# Patient Record
Sex: Male | Born: 1965 | Race: White | Hispanic: Yes | Marital: Single | Smoking: Never smoker
Health system: Southern US, Community
[De-identification: ages and names within clinical notes are randomized; demographics above are authoritative.]

## PROBLEM LIST (undated history)

## (undated) DIAGNOSIS — M109 Gout, unspecified: Secondary | ICD-10-CM

## (undated) DIAGNOSIS — N200 Calculus of kidney: Secondary | ICD-10-CM

## (undated) DIAGNOSIS — J329 Chronic sinusitis, unspecified: Secondary | ICD-10-CM

## (undated) DIAGNOSIS — J302 Other seasonal allergic rhinitis: Secondary | ICD-10-CM

## (undated) DIAGNOSIS — I10 Essential (primary) hypertension: Secondary | ICD-10-CM

## (undated) HISTORY — PX: ORIF RADIUS & ULNA FRACTURES: SHX2129

---

## 2009-02-06 ENCOUNTER — Encounter (INDEPENDENT_AMBULATORY_CARE_PROVIDER_SITE_OTHER): Payer: Self-pay | Admitting: Internal Medicine

## 2009-02-06 ENCOUNTER — Ambulatory Visit: Payer: Self-pay | Admitting: Family Medicine

## 2009-02-06 LAB — CONVERTED CEMR LAB
Albumin: 4.8 g/dL (ref 3.5–5.2)
Alkaline Phosphatase: 61 units/L (ref 39–117)
BUN: 17 mg/dL (ref 6–23)
Basophils Absolute: 0 10*3/uL (ref 0.0–0.1)
Basophils Relative: 1 % (ref 0–1)
Cholesterol: 213 mg/dL — ABNORMAL HIGH (ref 0–200)
Eosinophils Relative: 7 % — ABNORMAL HIGH (ref 0–5)
Glucose, Bld: 95 mg/dL (ref 70–99)
HCT: 48.9 % (ref 39.0–52.0)
HDL: 42 mg/dL (ref >39)
Hemoglobin: 17 g/dL (ref 13.0–17.0)
LDL Cholesterol: 140 mg/dL — ABNORMAL HIGH (ref 0–99)
MCHC: 34.8 g/dL (ref 30.0–36.0)
MCV: 91.2 fL (ref 78.0–100.0)
Monocytes Absolute: 0.4 10*3/uL (ref 0.1–1.0)
RDW: 12.3 % (ref 11.5–15.5)
Total Bilirubin: 0.4 mg/dL (ref 0.3–1.2)
Triglycerides: 156 mg/dL — ABNORMAL HIGH (ref <150)
VLDL: 31 mg/dL (ref 0–40)

## 2009-04-08 ENCOUNTER — Ambulatory Visit: Payer: Self-pay | Admitting: Family Medicine

## 2013-04-26 ENCOUNTER — Emergency Department (HOSPITAL_COMMUNITY): Payer: Self-pay

## 2013-04-26 ENCOUNTER — Encounter (HOSPITAL_COMMUNITY): Payer: Self-pay | Admitting: Emergency Medicine

## 2013-04-26 ENCOUNTER — Emergency Department (HOSPITAL_COMMUNITY)
Admission: EM | Admit: 2013-04-26 | Discharge: 2013-04-26 | Disposition: A | Discharge status: Home / Self Care | Payer: Self-pay | Attending: Emergency Medicine | Admitting: Emergency Medicine

## 2013-04-26 DIAGNOSIS — J3489 Other specified disorders of nose and nasal sinuses: Secondary | ICD-10-CM | POA: Insufficient documentation

## 2013-04-26 DIAGNOSIS — J189 Pneumonia, unspecified organism: Secondary | ICD-10-CM | POA: Insufficient documentation

## 2013-04-26 DIAGNOSIS — R062 Wheezing: Secondary | ICD-10-CM | POA: Insufficient documentation

## 2013-04-26 DIAGNOSIS — R350 Frequency of micturition: Secondary | ICD-10-CM | POA: Insufficient documentation

## 2013-04-26 DIAGNOSIS — E669 Obesity, unspecified: Secondary | ICD-10-CM | POA: Insufficient documentation

## 2013-04-26 DIAGNOSIS — R059 Cough, unspecified: Secondary | ICD-10-CM | POA: Insufficient documentation

## 2013-04-26 DIAGNOSIS — R05 Cough: Secondary | ICD-10-CM | POA: Insufficient documentation

## 2013-04-26 DIAGNOSIS — Z8639 Personal history of other endocrine, nutritional and metabolic disease: Secondary | ICD-10-CM | POA: Insufficient documentation

## 2013-04-26 DIAGNOSIS — M62838 Other muscle spasm: Secondary | ICD-10-CM | POA: Insufficient documentation

## 2013-04-26 DIAGNOSIS — R0981 Nasal congestion: Secondary | ICD-10-CM

## 2013-04-26 DIAGNOSIS — I10 Essential (primary) hypertension: Secondary | ICD-10-CM | POA: Insufficient documentation

## 2013-04-26 DIAGNOSIS — Z87442 Personal history of urinary calculi: Secondary | ICD-10-CM | POA: Insufficient documentation

## 2013-04-26 DIAGNOSIS — Z862 Personal history of diseases of the blood and blood-forming organs and certain disorders involving the immune mechanism: Secondary | ICD-10-CM | POA: Insufficient documentation

## 2013-04-26 DIAGNOSIS — R109 Unspecified abdominal pain: Secondary | ICD-10-CM | POA: Insufficient documentation

## 2013-04-26 HISTORY — DX: Calculus of kidney: N20.0

## 2013-04-26 HISTORY — DX: Gout, unspecified: M10.9

## 2013-04-26 HISTORY — DX: Essential (primary) hypertension: I10

## 2013-04-26 LAB — POCT I-STAT, CHEM 8
BUN: 15 mg/dL (ref 6–23)
Calcium, Ion: 1.18 mmol/L (ref 1.12–1.23)
Chloride: 105 mEq/L (ref 96–112)
Creatinine, Ser: 1.2 mg/dL (ref 0.50–1.35)
Glucose, Bld: 107 mg/dL — ABNORMAL HIGH (ref 70–99)
HCT: 46 % (ref 39.0–52.0)
Hemoglobin: 15.6 g/dL (ref 13.0–17.0)
Potassium: 3.7 mEq/L (ref 3.5–5.1)
Sodium: 141 mEq/L (ref 135–145)
TCO2: 27 mmol/L (ref 0–100)

## 2013-04-26 LAB — URINALYSIS, ROUTINE W REFLEX MICROSCOPIC
Glucose, UA: NEGATIVE mg/dL
Ketones, ur: NEGATIVE mg/dL
Leukocytes, UA: NEGATIVE
Specific Gravity, Urine: 1.02 (ref 1.005–1.030)
pH: 6 (ref 5.0–8.0)

## 2013-04-26 MED ORDER — AZITHROMYCIN 250 MG PO TABS
500.0000 mg | ORAL_TABLET | Freq: Once | ORAL | Status: AC
  Administered 2013-04-26: 500 mg via ORAL
  Filled 2013-04-26: qty 2

## 2013-04-26 MED ORDER — FLUTICASONE PROPIONATE 50 MCG/ACT NA SUSP: 2.0000 | Freq: Every day | NASAL | Status: DC

## 2013-04-26 MED ORDER — IBUPROFEN 800 MG PO TABS
800.0000 mg | ORAL_TABLET | Freq: Once | ORAL | Status: AC
  Administered 2013-04-26: 800 mg via ORAL
  Filled 2013-04-26: qty 1

## 2013-04-26 MED ORDER — AZITHROMYCIN 250 MG PO TABS: 250.0000 mg | ORAL_TABLET | Freq: Every day | ORAL | Status: DC

## 2013-04-26 NOTE — ED Notes (Signed)
Family states pt went to a dr on Saturday and was diagnosed with seasonal allergies and was given 2 shots one was an antibiotic and the other was an antihistamine shot  Pt has been using zyrtec and nasocort  Tuesday he woke up feeling fatigue and having diarrhea  Pt is c/o pain in his thigh all the way down to his knee  Pain increased about 3pm today feels like the bone in his leg is sore  Most pain is at the top of his knee  States the pain does not feel muscular but more like the bone hurts  Pain is in both legs

## 2013-04-26 NOTE — ED Provider Notes (Signed)
Medical screening examination/treatment/procedure(s) were performed by non-physician practitioner and as supervising physician I was immediately available for consultation/collaboration.  Judye Lorino, MD 04/26/13 0600 

## 2013-04-26 NOTE — ED Provider Notes (Signed)
History     CSN: [id number]  Arrival date & time 04/26/13  0219   First MD Initiated Contact with Patient 04/26/13 0244      Chief Complaint  Patient presents with  . Leg Pain    (Consider location/radiation/quality/duration/timing/severity/associated sxs/prior treatment) HPI History provided by pt and his wife who is translating.  Pt c/o severe pain bilateral anterior thighs, from hip to knee x 2 days.  Pain feels like it is in the bones.  Aggravated by palpation and bearing weight.  Associated w/ extremity weakness.  No known fever and denies low back pain, bladder/bowel dysfunction, paresthesias, rash.  No trauma.  Has never had these sx in the past.   Past Medical History  Diagnosis Date  . Hypertension   . Gout   . Kidney stone     History reviewed. No pertinent past surgical history.  Family History  Problem Relation Age of Onset  . Hypertension Other     History  Substance Use Topics  . Smoking status: Never Smoker   . Smokeless tobacco: Not on file  . Alcohol Use: No      Review of Systems  HENT: Positive for congestion and rhinorrhea.   Respiratory: Positive for cough and wheezing. Negative for chest tightness and shortness of breath.   Gastrointestinal: Negative for nausea and vomiting.       Isolated episode of abd pain and diarrhea last night  Genitourinary: Positive for frequency. Negative for dysuria and hematuria.       Mild right flank pain since 10pm last night.  H/o kidney stones.   All other systems reviewed and are negative.    Allergies  Review of patient's allergies indicates no known allergies.  Home Medications  No current outpatient prescriptions on file.  BP 122/68  Pulse 101  Temp(Src) 100.3 F (37.9 C) (Oral)  Resp 20  Ht 5\' 9"  (1.753 m)  Wt 280 lb (127.007 kg)  BMI 41.33 kg/m2  SpO2 100%  Physical Exam  Nursing note and vitals reviewed. Constitutional: He is oriented to person, place, and time. He appears well-developed  and well-nourished.  HENT:  Head: Normocephalic and atraumatic.  Eyes:  Normal appearance  Neck: Normal range of motion.  Cardiovascular: Normal rate and regular rhythm.   Pulmonary/Chest: Effort normal and breath sounds normal.  Abdominal: Soft. Bowel sounds are normal. He exhibits no distension. There is no tenderness.  obese  Genitourinary:  No CVA ttp  Musculoskeletal:  Lumbar spine non-tender. Tenderness bilateral hips and anterior thighs.  Full active ROM of LE.  Nml patellar reflexes.  5/5 hip abd/adduction/flexion and ankle plantar flexion strength.  No saddle anesthesia. Distal sensation intact.  2+ DP pulses.  Ambulates w/out diffulty.   Neurological: He is alert and oriented to person, place, and time.  Skin: Skin is warm and dry. No rash noted.  Psychiatric: He has a normal mood and affect. His behavior is normal.    ED Course  Procedures (including critical care time)  Labs Reviewed  POCT I-STAT, CHEM 8 - Abnormal; Notable for the following:    Glucose, Bld 107 (*)    All other components within normal limits  URINALYSIS, ROUTINE W REFLEX MICROSCOPIC   Dg Chest 2 View  04/26/2013   *RADIOLOGY REPORT*  Clinical Data: Shortness of breath; bilateral leg pain.  CHEST - 2 VIEW  Comparison: None.  Findings: The lungs are well-aerated.  Mild left basilar opacity may reflect atelectasis or possibly mild pneumonia.  Pulmonary vascularity  is at the upper limits of normal.  There is no evidence of pleural effusion or pneumothorax.  The heart is borderline enlarged.  No acute osseous abnormalities are seen.  IMPRESSION: Mild left basilar opacity may reflect atelectasis or possibly mild pneumonia.  No definite evidence for pulmonary edema.  Borderline cardiomegaly.   Original Report Authenticated By: Tonia Ghent, M.D.     1. Community acquired pneumonia   2. Nasal congestion       MDM  46yo M w/ h/o HTN, gout, kidney stones and allergic rhinitis presents w/ non-traumatic,  bilateral anterior thigh pain.  Has had both cough and increased urinary freq recently, and had an isolated episode of diarrhea as well as development of right flank pain last night.  New meds include zyrtec.  On exam, NAD, borderline febrile, bilateral ant thigh tenderness, no mid-line spinal tenderness, no NV deficits of LEs, ambulatory, abd benign and no CVA ttp.  CXR, U/A and potassium level pending. Pt to receive ibuprofen for fever/pain. 3:29 AM   U/A negative for infection/hematuria, and Chem 8 unremarkable.  CXR shows possible mild pna L lung base.  Results discussed w/ pt and his wife.  This is likely source of fever and body aches.  Reports resolution of pain w/ ibuprofen.  Received first dose of zithromax and d/c'd home w/ same + fluticasone for nasal congestion secondary to allergic rhinitis.  Advised him to d/c the afrin he has been taking daily for several weeks.   Suggested coridicin and saline nasal spray as well.  Return precautions discussed. 4:23 AM         Otilio Miu, PA-C 04/26/13 [phone]

## 2013-11-08 ENCOUNTER — Ambulatory Visit: Payer: Self-pay | Attending: Internal Medicine | Admitting: Internal Medicine

## 2013-11-08 VITALS — BP 132/86 | HR 100 | Temp 97.9°F | Resp 17 | Wt 289.0 lb

## 2013-11-08 DIAGNOSIS — R21 Rash and other nonspecific skin eruption: Secondary | ICD-10-CM

## 2013-11-08 DIAGNOSIS — J329 Chronic sinusitis, unspecified: Secondary | ICD-10-CM | POA: Insufficient documentation

## 2013-11-08 LAB — COMPREHENSIVE METABOLIC PANEL
Albumin: 4.8 g/dL (ref 3.5–5.2)
Alkaline Phosphatase: 78 U/L (ref 39–117)
BUN: 16 mg/dL (ref 6–23)
CO2: 28 mEq/L (ref 19–32)
Calcium: 10.1 mg/dL (ref 8.4–10.5)
Chloride: 100 mEq/L (ref 96–112)
Glucose, Bld: 101 mg/dL — ABNORMAL HIGH (ref 70–99)
Potassium: 4.2 mEq/L (ref 3.5–5.3)
Sodium: 137 mEq/L (ref 135–145)
Total Protein: 7.3 g/dL (ref 6.0–8.3)

## 2013-11-08 MED ORDER — AZITHROMYCIN 250 MG PO TABS: 250.0000 mg | ORAL_TABLET | Freq: Every day | ORAL | Status: DC

## 2013-11-08 MED ORDER — ALBUTEROL SULFATE HFA 108 (90 BASE) MCG/ACT IN AERS: 2.0000 | INHALATION_SPRAY | Freq: Four times a day (QID) | RESPIRATORY_TRACT | Status: DC | PRN

## 2013-11-08 NOTE — Progress Notes (Signed)
Patient ID: Thomas Woodard, male   DOB: [birth year], 47 y.o.   MRN: [id number]   CC: Congestion  HPI: Patient is 47 year old male who presents to clinic with main concern of progressively worsening nasal congestion, bilateral ear pain, postnasal drip, associated with subjective fevers and chills, cough productive of clear sputum. Patient explained that his symptoms have been chronic in nature but much worse over the past week. He is unable to sleep. He has been taking Afrin spray and explains that it appeared that his symptoms were getting worse. He denies chest pain or shortness of breath, no specific abdominal or urinary concerns. He also brings up concerns with bilateral facial skin change that appears as some burn with similar appearance for her head. He also explains he has had some heat and cold intolerance but is not able to provide specific details.  No Known Allergies Past Medical History  Diagnosis Date  . Hypertension   . Gout   . Kidney stone    Current Outpatient Prescriptions on File Prior to Visit  Medication Sig Dispense Refill  . cetirizine (ZYRTEC) 10 MG tablet Take 10 mg by mouth daily.      . fluticasone (FLONASE) 50 MCG/ACT nasal spray Place 2 sprays into the nose daily.  16 g  2  . triamcinolone (NASACORT) 55 MCG/ACT nasal inhaler Place 2 sprays into the nose 3 (three) times daily.       No current facility-administered medications on file prior to visit.   Family History  Problem Relation Age of Onset  . Hypertension Other    History   Social History  . Marital Status: Single    Spouse Name: N/A    Number of Children: N/A  . Years of Education: N/A   Occupational History  . Not on file.   Social History Main Topics  . Smoking status: Never Smoker   . Smokeless tobacco: Not on file  . Alcohol Use: No  . Drug Use: No  . Sexual Activity: Not on file   Other Topics Concern  . Not on file   Social History Narrative  . No narrative on file     Review of Systems  Constitutional: Per history of present illness HENT: Per history of present illness Eyes: Negative for pain, discharge, redness, itching and visual disturbance.  Respiratory: Per history of present illness Cardiovascular: Negative for chest pain, palpitations and leg swelling.  Gastrointestinal: Negative for abdominal distention.  Genitourinary: Negative for dysuria, urgency, frequency, hematuria, flank pain, decreased urine volume, difficulty urinating and dyspareunia.  Musculoskeletal: Negative for back pain, joint swelling, arthralgias and gait problem.  Neurological: Negative for dizziness, tremors, seizures, syncope, facial asymmetry, speech difficulty, weakness, light-headedness, numbness and headaches.  Hematological: Negative for adenopathy. Does not bruise/bleed easily.  Psychiatric/Behavioral: Negative for hallucinations, behavioral problems, confusion, dysphoric mood, decreased concentration and agitation.    Objective:   Filed Vitals:   11/08/13 1557  BP: 132/86  Pulse: 100  Temp: 97.9 F (36.6 C)  Resp: 17    Physical Exam  Constitutional: Appears well-developed and well-nourished. No distress.  HENT: bilateral ears tympanic membranes very difficult to visualize secondary to significant erythema and edema, significant tenderness with examination in both ears. Nasal congestion noted. Maxillary and frontal sinuses tender bilaterally Eyes: Conjunctivae and EOM are normal. PERRLA, no scleral icterus.  Neck: Normal ROM. Neck supple. No JVD. No tracheal deviation. No thyromegaly.  CVS: RRR, S1/S2 +, no murmurs, no gallops, no carotid bruit.  Pulmonary: Effort and breath  sounds normal, no stridor, rhonchi, wheezes, rales.  Abdominal: Soft. BS +,  no distension, tenderness, rebound or guarding.  Musculoskeletal: Normal range of motion. No edema and no tenderness.  Lymphadenopathy: No lymphadenopathy noted, cervical, inguinal. Neuro: Alert. Normal  reflexes, muscle tone coordination. No cranial nerve deficit. Skin: Bilateral facial macular redness, hyperpigmented area on 4 head  Psychiatric: Normal mood and affect. Behavior, judgment, thought content normal.   Lab Results  Component Value Date   WBC 4.9 02/06/2009   HGB 15.6 04/26/2013   HCT 46.0 04/26/2013   MCV 91.2 02/06/2009   PLT 289 02/06/2009   Lab Results  Component Value Date   CREATININE 1.20 04/26/2013   BUN 15 04/26/2013   NA 141 04/26/2013   K 3.7 04/26/2013   CL 105 04/26/2013   CO2 20 02/06/2009    No results found for this basename: HGBA1C   Lipid Panel     Component Value Date/Time   CHOL 213* 02/06/2009 2015   TRIG 156* 02/06/2009 2015   HDL 42 02/06/2009 2015   CHOLHDL 5.1 Ratio 02/06/2009 2015   VLDL 31 02/06/2009 2015   LDLCALC 140* 02/06/2009 2015       Assessment and plan:   Chronic sinusitis with acute bilateral ear infections - will place on Zithromax as patient explains he has been on Augmentin and amoxicillin in the past but no significant relief. Will also provide albuterol inhaler as needed. Referral to ENT provided for further evaluation of chronic sinusitis. Patient advised to come back to clinic if his symptoms do not improve or if it gets worse. Skin changes - will test ANA, TSH

## 2013-11-08 NOTE — Progress Notes (Signed)
Patient is here for sinus problem Has been having SOB  Congestion Cant sleep Dizzy Has been using afrin nasal spray for almost a year with no relief

## 2013-11-08 NOTE — Patient Instructions (Signed)
Sinusitis   (Sinusitis)  La sinusitis es el enrojecimiento, sensibilidad e hinchazón (inflamación) de los senos paranasales. Los senos paranasales son bolsas de aire que se encuentran dentro de los huesos de la cara (por debajo de los ojos, en la mitad de la frente o por encima de los ojos). En los senos paranasales sanos, el moco puede drenar y el aire circula a través de ellos en su camino hacia la nariz. Sin embargo, cuando se inflaman, el moco y el aire quedan atrapados. Esto hace que se desarrollen bacterias y otros gérmenes y originen una infección.    La sinusitis puede desarrollarse rápidamente y durar sólo un tiempo corto (aguda) o continuar por un período largo (crónica). La sinusitis que dura más de 12 semanas se considera crónica.   CAUSAS   Las causas de la sinusitis son:   · Alergias  · Las anomalías estructurales, como el desplazamiento del cartílago que separa las fosas nasales (desvío del tabique) pueden disminuir el flujo de aire por la nariz y los senos paranasales y afectar su drenaje.  · Las alteraciones funcionales, como cuando los pequeños pelos (cilias) que se encuentran en los senos nasales y que ayudan a eliminar la mucosidad no funcionan correctamente o no están presentes.  SÍNTOMAS   Los síntomas de sinusitis aguda y crónica son los mismos. Los síntomas principales son el dolor y la presión alrededor de los senos paranasales afectados. Otros síntomas son:   · Dolor en los dientes superiores.  · Dolor de oídos.  · Dolor de cabeza.  · Mal aliento.  · Disminución del sentido del olfato y del gusto.  · Tos, que empeora al acostarse.  · Fatiga.  · Fiebre.  · Drenaje de moco espeso por la nariz, que generalmente es de color verde y puede contener pus (purulento).  · Hinchazón y calor en los senos paranasales afectados.  DIAGNÓSTICO   El médico le hará un examen físico. Durante el examen, el médico:   · Revisará su nariz buscando signos de crecimientos anormales en las fosas nasales (pólipos  nasales).  · Palpará los senos paranasales afectados para buscar signos de infección.  · Observará el interior de los senos paranasales (endoscopía) con un dispositivo especial que emite luz (endoscopio) colocándolo dentro de los senos paranasales.  Si el médico sospecha que usted sufre sinusitis crónica, podrá indicar una o más de las siguientes pruebas:   · Pruebas de alergia.  · Cultivo de secreciones nasales: tomará una muestra del moco nasal y la enviará a un laboratorio para detectar bacterias.  · Citología nasal: el médico tomará una muestra de moco de la nariz para determinar si la sinusitis que usted sufre está relacionada con una alergia.  TRATAMIENTO   La mayoría de los casos de sinusitis aguda se deben a una infección viral y se resuelven espontáneamente dentro de los 10 días. En algunos casos se recetan medicamentos para aliviar los síntomas (analgésicos, descongestivos, aerosoles nasales con corticoides o aerosoles salinos).   Sin embargo, para la sinusitis por infección bacteriana, el médico le recetará antibióticos. Los antibióticos son medicamentos que destruyen las bacterias que causan la infección.   Rara vez la sinusitis tiene su origen en una infección por hongos. En estos casos, el médico le recetará un medicamento antifúngico.   Para algunos casos de sinusitis crónica, es necesario someterse a una cirugía. Generalmente se trata de casos en los que la sinusitis se repite más de 3 veces al año, a pesar de otros tratamientos.     INSTRUCCIONES PARA EL CUIDADO EN EL HOGAR   · Tiene que beber gran cantidad de agua. Los líquidos ayudan a disolver el moco para que drene más fácilmente de los senos paranasales.  · Use un humidificador.  · Inhale vapor de 3 a 4 veces al día (por ejemplo, siéntese en el baño con la ducha abierta).  · Aplique un paño tibio y húmedo en su cara 3 ó 4 veces al día, o según las indicaciones de su médico.  · Use un aerosol nasal salino para ayudar a humedecer y limpiar los  senos nasales.  · Tome medicamentos de venta libre o recetados para el aliviar el dolor, el malestar o la fiebre sólo según las indicaciones de su médico.  SOLICITE ATENCIÓN MÉDICA DE INMEDIATO SI:   · Siente más dolor o sufre dolores de cabeza intensos.  · Tiene náuseas, vómitos o somnolencia.  · Observa hinchazón alrededor del rostro.  · Tiene problemas de visión.  · Presenta rigidez en el cuello.  · Tiene dificultad para respirar.  ASEGÚRESE DE QUE:   · Comprende estas instrucciones.  · Controlará su enfermedad.  · Recibirá ayuda de inmediato si no mejora o si empeora.  Document Released: 08/18/2005 Document Revised: 07/11/2013  [employer]® Patient Information ©2014 [employer].

## 2013-11-14 ENCOUNTER — Ambulatory Visit: Payer: Self-pay

## 2013-11-30 ENCOUNTER — Inpatient Hospital Stay (HOSPITAL_COMMUNITY): Admission: RE | Admit: 2013-11-30 | Payer: Self-pay | Source: Ambulatory Visit

## 2013-12-04 ENCOUNTER — Ambulatory Visit (HOSPITAL_COMMUNITY): Admission: RE | Admit: 2013-12-04 | Payer: Self-pay | Source: Ambulatory Visit | Admitting: Otolaryngology

## 2013-12-04 ENCOUNTER — Encounter (HOSPITAL_COMMUNITY): Admission: RE | Payer: Self-pay | Source: Ambulatory Visit

## 2013-12-04 SURGERY — SINUS SURGERY, ENDOSCOPIC, USING COMPUTER-ASSISTED NAVIGATION
Anesthesia: General | Laterality: Bilateral

## 2013-12-06 ENCOUNTER — Ambulatory Visit: Payer: Self-pay

## 2013-12-25 ENCOUNTER — Telehealth: Payer: Self-pay | Admitting: Internal Medicine

## 2013-12-25 NOTE — Telephone Encounter (Signed)
Pt's sister came in today to inform us that the Pt will be receiving surgery soon; Pt is asking us to send authorization [facility]ers[facility]; please f/u

## 2014-03-11 ENCOUNTER — Encounter (HOSPITAL_COMMUNITY): Payer: Self-pay | Admitting: *Deleted

## 2014-03-11 NOTE — Progress Notes (Signed)
Pt does not speek english-will get interpreter-girlfriend speaks very well-he probably has sleep apnea-will ck with anethesia

## 2014-03-11 NOTE — Progress Notes (Signed)
03/11/14 1146  OBSTRUCTIVE SLEEP APNEA  Have you ever been diagnosed with sleep apnea through a sleep study? No  Do you snore loudly (loud enough to be heard through closed doors)?  1  Do you often feel tired, fatigued, or sleepy during the daytime? 0  Has anyone observed you stop breathing during your sleep? 1  Do you have, or are you being treated for high blood pressure? 0  BMI more than 35 kg/m2? 1  Age over 46 years old? 0  Neck circumference greater than 40 cm/16 inches? 1  Gender: 1  Obstructive Sleep Apnea Score 5  Score 4 or greater  Results sent to PCP

## 2014-03-11 NOTE — Progress Notes (Signed)
Reviewed with dr crews-should be ok for here

## 2014-03-13 ENCOUNTER — Ambulatory Visit (HOSPITAL_BASED_OUTPATIENT_CLINIC_OR_DEPARTMENT_OTHER): Payer: 59 | Admitting: Anesthesiology

## 2014-03-13 ENCOUNTER — Encounter (HOSPITAL_BASED_OUTPATIENT_CLINIC_OR_DEPARTMENT_OTHER)
Admission: RE | Disposition: A | Discharge status: Home / Self Care | Payer: Self-pay | Source: Ambulatory Visit | Attending: Otolaryngology

## 2014-03-13 ENCOUNTER — Encounter (HOSPITAL_COMMUNITY): Payer: Self-pay | Admitting: Anesthesiology

## 2014-03-13 ENCOUNTER — Encounter (HOSPITAL_BASED_OUTPATIENT_CLINIC_OR_DEPARTMENT_OTHER): Payer: 59 | Admitting: Anesthesiology

## 2014-03-13 ENCOUNTER — Ambulatory Visit (HOSPITAL_COMMUNITY)
Admission: RE | Admit: 2014-03-13 | Discharge: 2014-03-13 | Disposition: A | Discharge status: Home / Self Care | Payer: 59 | Source: Ambulatory Visit | Attending: Otolaryngology | Admitting: Otolaryngology

## 2014-03-13 DIAGNOSIS — J322 Chronic ethmoidal sinusitis: Secondary | ICD-10-CM | POA: Insufficient documentation

## 2014-03-13 DIAGNOSIS — I10 Essential (primary) hypertension: Secondary | ICD-10-CM | POA: Insufficient documentation

## 2014-03-13 DIAGNOSIS — J320 Chronic maxillary sinusitis: Secondary | ICD-10-CM | POA: Insufficient documentation

## 2014-03-13 DIAGNOSIS — M109 Gout, unspecified: Secondary | ICD-10-CM | POA: Insufficient documentation

## 2014-03-13 DIAGNOSIS — J329 Chronic sinusitis, unspecified: Secondary | ICD-10-CM

## 2014-03-13 DIAGNOSIS — J343 Hypertrophy of nasal turbinates: Secondary | ICD-10-CM | POA: Insufficient documentation

## 2014-03-13 DIAGNOSIS — J321 Chronic frontal sinusitis: Secondary | ICD-10-CM | POA: Insufficient documentation

## 2014-03-13 DIAGNOSIS — J301 Allergic rhinitis due to pollen: Secondary | ICD-10-CM | POA: Insufficient documentation

## 2014-03-13 HISTORY — DX: Chronic sinusitis, unspecified: J32.9

## 2014-03-13 HISTORY — PX: TURBINATE REDUCTION: SHX6157

## 2014-03-13 HISTORY — PX: SINUS ENDO W/FUSION: SHX777

## 2014-03-13 HISTORY — DX: Other seasonal allergic rhinitis: J30.2

## 2014-03-13 LAB — POCT HEMOGLOBIN-HEMACUE: Hemoglobin: 15.7 g/dL (ref 13.0–17.0)

## 2014-03-13 SURGERY — SINUS SURGERY, ENDOSCOPIC, USING COMPUTER-ASSISTED NAVIGATION
Anesthesia: General

## 2014-03-13 MED ORDER — MIDAZOLAM HCL 2 MG/2ML IJ SOLN
INTRAMUSCULAR | Status: AC
  Filled 2014-03-13: qty 2

## 2014-03-13 MED ORDER — METOCLOPRAMIDE HCL 5 MG/ML IJ SOLN
INTRAMUSCULAR | Status: DC | PRN
  Administered 2014-03-13: 10 mg via INTRAVENOUS

## 2014-03-13 MED ORDER — MIDAZOLAM HCL 2 MG/2ML IJ SOLN: 1.0000 mg | INTRAMUSCULAR | Status: DC | PRN

## 2014-03-13 MED ORDER — LACTATED RINGERS IV SOLN
INTRAVENOUS | Status: DC | PRN
  Administered 2014-03-13 (×2): via INTRAVENOUS

## 2014-03-13 MED ORDER — ONDANSETRON HCL 4 MG/2ML IJ SOLN: 4.0000 mg | Freq: Once | INTRAMUSCULAR | Status: DC | PRN

## 2014-03-13 MED ORDER — PROPOFOL 10 MG/ML IV BOLUS
INTRAVENOUS | Status: AC
  Filled 2014-03-13: qty 20

## 2014-03-13 MED ORDER — MIDAZOLAM HCL 5 MG/5ML IJ SOLN
INTRAMUSCULAR | Status: DC | PRN
  Administered 2014-03-13: 2 mg via INTRAVENOUS

## 2014-03-13 MED ORDER — FENTANYL CITRATE 0.05 MG/ML IJ SOLN: 50.0000 ug | INTRAMUSCULAR | Status: DC | PRN

## 2014-03-13 MED ORDER — DEXAMETHASONE SODIUM PHOSPHATE 4 MG/ML IJ SOLN
INTRAMUSCULAR | Status: DC | PRN
  Administered 2014-03-13: 10 mg via INTRAVENOUS

## 2014-03-13 MED ORDER — CLINDAMYCIN HCL 300 MG PO CAPS: 300.0000 mg | ORAL_CAPSULE | Freq: Three times a day (TID) | ORAL | Status: DC

## 2014-03-13 MED ORDER — FENTANYL CITRATE 0.05 MG/ML IJ SOLN
INTRAMUSCULAR | Status: AC
Start: 2014-03-13 — End: 2014-03-13
  Filled 2014-03-13: qty 8

## 2014-03-13 MED ORDER — LACTATED RINGERS IV SOLN
INTRAVENOUS | Status: DC
  Administered 2014-03-13 (×2): via INTRAVENOUS

## 2014-03-13 MED ORDER — OXYCODONE HCL 5 MG PO TABS
5.0000 mg | ORAL_TABLET | Freq: Once | ORAL | Status: AC | PRN
  Administered 2014-03-13: 5 mg via ORAL

## 2014-03-13 MED ORDER — HYDROMORPHONE HCL PF 1 MG/ML IJ SOLN
INTRAMUSCULAR | Status: AC
  Filled 2014-03-13: qty 1

## 2014-03-13 MED ORDER — ALBUTEROL SULFATE HFA 108 (90 BASE) MCG/ACT IN AERS
INHALATION_SPRAY | RESPIRATORY_TRACT | Status: DC | PRN
  Administered 2014-03-13: 2 via RESPIRATORY_TRACT

## 2014-03-13 MED ORDER — LIDOCAINE HCL (CARDIAC) 10 MG/ML IV SOLN
INTRAVENOUS | Status: DC | PRN
  Administered 2014-03-13: 100 mg via INTRAVENOUS

## 2014-03-13 MED ORDER — ONDANSETRON HCL 4 MG/2ML IJ SOLN
INTRAMUSCULAR | Status: DC | PRN
  Administered 2014-03-13: 4 mg via INTRAVENOUS

## 2014-03-13 MED ORDER — PROPOFOL 10 MG/ML IV BOLUS
INTRAVENOUS | Status: DC | PRN
  Administered 2014-03-13: 300 mg via INTRAVENOUS
  Administered 2014-03-13: 20 mg via INTRAVENOUS
  Administered 2014-03-13: 30 mg via INTRAVENOUS

## 2014-03-13 MED ORDER — HYDROMORPHONE HCL PF 1 MG/ML IJ SOLN
0.2500 mg | INTRAMUSCULAR | Status: DC | PRN
  Administered 2014-03-13 (×2): 0.5 mg via INTRAVENOUS
  Administered 2014-03-13: 0.25 mg via INTRAVENOUS

## 2014-03-13 MED ORDER — FENTANYL CITRATE 0.05 MG/ML IJ SOLN
INTRAMUSCULAR | Status: DC | PRN
  Administered 2014-03-13: 50 ug via INTRAVENOUS
  Administered 2014-03-13: 25 ug via INTRAVENOUS
  Administered 2014-03-13: 50 ug via INTRAVENOUS
  Administered 2014-03-13 (×2): 25 ug via INTRAVENOUS
  Administered 2014-03-13: 50 ug via INTRAVENOUS
  Administered 2014-03-13: 25 ug via INTRAVENOUS
  Administered 2014-03-13: 100 ug via INTRAVENOUS

## 2014-03-13 MED ORDER — NEOSTIGMINE METHYLSULFATE 1 MG/ML IJ SOLN
INTRAMUSCULAR | Status: DC | PRN
  Administered 2014-03-13: 3 mg via INTRAVENOUS

## 2014-03-13 MED ORDER — OXYCODONE HCL 5 MG PO TABS
ORAL_TABLET | ORAL | Status: AC
  Filled 2014-03-13: qty 1

## 2014-03-13 MED ORDER — PHENYLEPHRINE HCL 10 MG/ML IJ SOLN: INTRAMUSCULAR | Status: DC | PRN

## 2014-03-13 MED ORDER — BACITRACIN ZINC 500 UNIT/GM EX OINT
TOPICAL_OINTMENT | CUTANEOUS | Status: AC
  Filled 2014-03-13: qty 28.35

## 2014-03-13 MED ORDER — OXYMETAZOLINE HCL 0.05 % NA SOLN
NASAL | Status: DC | PRN
  Administered 2014-03-13: 1 via NASAL

## 2014-03-13 MED ORDER — EPHEDRINE SULFATE 50 MG/ML IJ SOLN
INTRAMUSCULAR | Status: DC | PRN
  Administered 2014-03-13: 10 mg via INTRAVENOUS

## 2014-03-13 MED ORDER — OXYMETAZOLINE HCL 0.05 % NA SOLN
NASAL | Status: AC
  Filled 2014-03-13: qty 15

## 2014-03-13 MED ORDER — LIDOCAINE-EPINEPHRINE 1 %-1:100000 IJ SOLN
INTRAMUSCULAR | Status: DC | PRN
  Administered 2014-03-13: 3 mL

## 2014-03-13 MED ORDER — BACITRACIN ZINC 500 UNIT/GM EX OINT
TOPICAL_OINTMENT | CUTANEOUS | Status: DC | PRN
  Administered 2014-03-13: 1 via TOPICAL

## 2014-03-13 MED ORDER — ROCURONIUM BROMIDE 100 MG/10ML IV SOLN
INTRAVENOUS | Status: DC | PRN
  Administered 2014-03-13: 50 mg via INTRAVENOUS

## 2014-03-13 MED ORDER — HYDROCODONE-ACETAMINOPHEN 5-500 MG PO TABS: 1.0000 | ORAL_TABLET | Freq: Four times a day (QID) | ORAL | Status: DC | PRN

## 2014-03-13 MED ORDER — OXYCODONE HCL 5 MG/5ML PO SOLN: 5.0000 mg | Freq: Once | ORAL | Status: AC | PRN

## 2014-03-13 MED ORDER — GLYCOPYRROLATE 0.2 MG/ML IJ SOLN
INTRAMUSCULAR | Status: DC | PRN
  Administered 2014-03-13: 0.4 mg via INTRAVENOUS

## 2014-03-13 MED ORDER — LIDOCAINE-EPINEPHRINE 1 %-1:100000 IJ SOLN
INTRAMUSCULAR | Status: AC
  Filled 2014-03-13: qty 1

## 2014-03-13 SURGICAL SUPPLY — 53 items
ATTRACTOMAT 16X20 MAGNETIC DRP (DRAPES) IMPLANT
BLADE RAD40 ROTATE 4M 4 5PK (BLADE) IMPLANT
BLADE RAD60 ROTATE M4 4 5PK (BLADE) IMPLANT
BLADE ROTATE RAD 12 4 M4 (BLADE) IMPLANT
BLADE ROTATE RAD 40 4 M4 (BLADE) ×3 IMPLANT
BLADE ROTATE RAD12 5PK M4 4MM (BLADE) IMPLANT
BLADE ROTATE TRICUT 4X13 M4 (BLADE) ×3 IMPLANT
BLADE TRICUT ROTATE M4 4 5PK (BLADE) IMPLANT
BUR HS RAD FRONTAL 3 (BURR) IMPLANT
CANISTER SUC SOCK COL 7IN (MISCELLANEOUS) ×6 IMPLANT
CANISTER SUCT 1200ML W/VALVE (MISCELLANEOUS) ×3 IMPLANT
COAGULATOR SUCT SWTCH 10FR 6 (ELECTROSURGICAL) ×3 IMPLANT
DECANTER SPIKE VIAL GLASS SM (MISCELLANEOUS) ×3 IMPLANT
DRAPE SURG 17X23 STRL (DRAPES) IMPLANT
DRESSING NASAL KENNEDY 3.5X.9 (MISCELLANEOUS) IMPLANT
DRSG NASAL KENNEDY 3.5X.9 (MISCELLANEOUS)
DRSG NASOPORE 8CM (GAUZE/BANDAGES/DRESSINGS) ×6 IMPLANT
DRSG TELFA 3X8 NADH (GAUZE/BANDAGES/DRESSINGS) IMPLANT
ELECT COATED BLADE 2.86 ST (ELECTRODE) IMPLANT
ELECT REM PT RETURN 9FT ADLT (ELECTROSURGICAL) ×3
ELECTRODE REM PT RTRN 9FT ADLT (ELECTROSURGICAL) ×2 IMPLANT
GLOVE BIOGEL PI IND STRL 7.0 (GLOVE) ×2 IMPLANT
GLOVE BIOGEL PI INDICATOR 7.0 (GLOVE) ×1
GLOVE ECLIPSE 6.5 STRL STRAW (GLOVE) ×3 IMPLANT
GLOVE EXAM NITRILE MD LF STRL (GLOVE) ×3 IMPLANT
GLOVE SS BIOGEL STRL SZ 7.5 (GLOVE) ×2 IMPLANT
GLOVE SUPERSENSE BIOGEL SZ 7.5 (GLOVE) ×1
GOWN STRL REUS W/ TWL LRG LVL3 (GOWN DISPOSABLE) ×4 IMPLANT
GOWN STRL REUS W/TWL LRG LVL3 (GOWN DISPOSABLE) ×2
IV NS 1000ML (IV SOLUTION)
IV NS 1000ML BAXH (IV SOLUTION) IMPLANT
IV NS 500ML (IV SOLUTION) ×1
IV NS 500ML BAXH (IV SOLUTION) ×2 IMPLANT
NEEDLE 27GAX1X1/2 (NEEDLE) ×3 IMPLANT
NEEDLE SPNL 25GX3.5 QUINCKE BL (NEEDLE) IMPLANT
NS IRRIG 1000ML POUR BTL (IV SOLUTION) ×3 IMPLANT
PACK BASIN DAY SURGERY FS (CUSTOM PROCEDURE TRAY) ×3 IMPLANT
PACK ENT DAY SURGERY (CUSTOM PROCEDURE TRAY) ×3 IMPLANT
PAD ENT ADHESIVE 25PK (MISCELLANEOUS) ×3 IMPLANT
PATTIES SURGICAL .5 X3 (DISPOSABLE) ×3 IMPLANT
PENCIL FOOT CONTROL (ELECTRODE) IMPLANT
SOLUTION ANTI FOG 6CC (MISCELLANEOUS) ×3 IMPLANT
SPONGE GAUZE 2X2 8PLY STRL LF (GAUZE/BANDAGES/DRESSINGS) ×3 IMPLANT
SPONGE SURGIFOAM ABS GEL 12-7 (HEMOSTASIS) IMPLANT
SUT CHROMIC 3 0 PS 2 (SUTURE) IMPLANT
SUT ETHILON 3 0 PS 1 (SUTURE) IMPLANT
TOWEL OR 17X24 6PK STRL BLUE (TOWEL DISPOSABLE) ×3 IMPLANT
TRACKER ENT INSTRUMENT (MISCELLANEOUS) ×3 IMPLANT
TRACKER ENT PATIENT (MISCELLANEOUS) ×3 IMPLANT
TRAY DSU PREP LF (CUSTOM PROCEDURE TRAY) ×3 IMPLANT
TUBE CONNECTING 20X1/4 (TUBING) ×3 IMPLANT
TUBING STRAIGHTSHOT EPS 5PK (TUBING) ×3 IMPLANT
YANKAUER SUCT BULB TIP NO VENT (SUCTIONS) ×3 IMPLANT

## 2014-03-13 NOTE — Op Note (Signed)
Preop/postop diagnosis: Chronic sinusitis and turbinate hypertrophy Procedure: Bilateral maxillary antrostomy, bilateral total ethmoidectomy, bilateral nasal frontal exploration, bilateral submucous resection inferior turbinates, and Fusion computer guidance Anesthesia: Gen. Estimated blood loss: Proxy 600 cc Indications: This is a 48 year old with significant nasal obstruction that has been present for many years. He is very frustrated as he has failed medical therapy. He has a CT scan that shows significant polypoid appearance of his turbinates in nasal cavity and chronic sinusitis. He was informed a risk and benefits of the procedure and options were discussed for endoscopic sinus surgery and turbinate reduction. All his questions were answered and consent was obtained. Procedure: Patient was taken to the operating room placed in the supine position after general endotracheal tube anesthesia was prepped and draped in the usual sterile manner. The fusion computer system was positioned calibrated with excellent accuracy. The oxymetazoline pledgets were placed in the inferior and middle turbinate were injected with 1% lidocaine with 1 100,000 epinephrine. The left side was begun which had significant polypoid degeneration of both the inferior and middle turbinate. The middle turbinate was debrided on its lateral aspect opening up the infundibulum. There was polypoid material within the infundibulum also debrided with the microdebrider and fusion guidance. Antrostomy was opened as the uncinate process also was removed from the  maxillary region up into the attachment middle turbinate. The maxillary sinus had thickened mucosa and some polyps right at its opening. The ethmoid bulla was then opened and dissection was carried from posterior to anterior up into the nasal frontal duct again using Fusion guidance and the microdebrider. There was polypoid material throughout and all of his mucosa was excessively bloody.  The inferior turbinate was then addressed which was hypertrophied and polypoid degenerated all the way to the posterior aspect into the nasopharynx region. Using the microdebrider this was removed along its inferior edge. The turbinate was then outfractured the Therapist, nutritionalreer elevator. The edge was cauterized with suction cautery. The right side was repeated in a similar fashion again with polypoid degeneration of both inferior and middle turbinates. The ethmoid, maxillary, frontal were opened as described previously and again polypoid material throughout. The mucosa again was extremely hyperemic with a lot of bleeding. The inferior turbinate also was similar and also trimmed with a microdebrider. This opened up the nasal cavity significantly bilaterally. Edge was cauterized with suction cautery and flap was laid back down and outfracture. There was still some fairly moderate bleeding from the ethmoid region bilaterally some nasal poor soaked in Bactroban was placed into the ethmoid cavities bilaterally. This did give good hemostasis. The nasopharynx was suctioned out of blood and debris. Turbinate seemed to have excellent hemostasis. The oral cavity oropharynx is suctioned out of all blood and debris. The patient is an awake and brought to recovery room in stable condition counts correct

## 2014-03-13 NOTE — Anesthesia Procedure Notes (Signed)
Procedure Name: Intubation Date/Time: 03/13/2014 8:45 AM Performed by: Gar GibbonKEETON, Kathya Wilz S Pre-anesthesia Checklist: Patient identified, Emergency Drugs available, Suction available and Patient being monitored Patient Re-evaluated:Patient Re-evaluated prior to inductionOxygen Delivery Method: Circle System Utilized Preoxygenation: Pre-oxygenation with 100% oxygen Intubation Type: IV induction Ventilation: Mask ventilation without difficulty Grade View: Grade III Tube type: Oral Rae Tube size: 8.0 mm Number of attempts: 1 Airway Equipment and Method: stylet,  oral airway and Video-laryngoscopy Placement Confirmation: ETT inserted through vocal cords under direct vision,  positive ETCO2 and breath sounds checked- equal and bilateral Secured at: 22 cm Tube secured with: Tape Dental Injury: Teeth and Oropharynx as per pre-operative assessment  Difficulty Due To: Difficulty was anticipated, Difficult Airway- due to reduced neck mobility and Difficult Airway- due to limited oral opening

## 2014-03-13 NOTE — H&P (Signed)
Thomas Woodard is an 48 y.o. male.   Chief Complaint: nasal obstruction HPI: hx of severe nasal obstruction with failed medical therapy.  Past Medical History  Diagnosis Date  . Gout   . Kidney stone   . Hypertension     no meds  . Chronic sinusitis   . Seasonal allergies     Past Surgical History  Procedure Laterality Date  . Orif radius & ulna fractures      right arm as a teen    Family History  Problem Relation Age of Onset  . Hypertension Other    Social History:  reports that he has never smoked. He does not have any smokeless tobacco history on file. He reports that he does not drink alcohol or use illicit drugs.  Allergies: No Known Allergies  Medications Prior to Admission  Medication Sig Dispense Refill  . albuterol (PROVENTIL HFA;VENTOLIN HFA) 108 (90 BASE) MCG/ACT inhaler Inhale 2 puffs into the lungs every 6 (six) hours as needed for wheezing or shortness of breath.  1 Inhaler  3  . cetirizine (ZYRTEC) 10 MG tablet Take 10 mg by mouth daily.      Marland Kitchen. oxymetazoline (AFRIN 12 HOUR) 0.05 % nasal spray Place 1 spray into both nostrils 2 (two) times daily.        No results found for this or any previous visit (from the past 48 hour(s)). No results found.  Review of Systems  Constitutional: Negative.   HENT: Negative.   Eyes: Negative.   Respiratory: Negative.   Cardiovascular: Negative.   Skin: Negative.     Blood pressure 125/86, pulse 80, temperature 97.5 F (36.4 C), temperature source Oral, resp. rate 20, height 5\' 9"  (1.753 m), weight 128.277 kg (282 lb 12.8 oz), SpO2 94.00%. Physical Exam  Constitutional: He appears well-developed and well-nourished.  HENT:  Head: Normocephalic.  Mouth/Throat: Oropharynx is clear and moist.  Eyes: Pupils are equal, round, and reactive to light.  Neck: Normal range of motion. Neck supple.  Cardiovascular: Normal rate.   Respiratory: Effort normal.  GI: Soft.     Assessment/Plan Chronic  sinusitis/turbinate hypertrophy- he is ready to proceed with surgery of ESS and SMR.   Thomas ObeyJohn Mahogony Woodard 03/13/2014, 8:12 AM

## 2014-03-13 NOTE — Anesthesia Postprocedure Evaluation (Signed)
  Anesthesia Post-op Note  Patient: Thomas Woodard  Procedure(s) Performed: Procedure(s): ENDOSCOPIC SINUS SURGERY WITH FUSION NAVIGATION (N/A) BILATERAL TURBINATE REDUCTION (Bilateral)  Patient Location: PACU  Anesthesia Type:General  Level of Consciousness: awake, alert  and oriented  Airway and Oxygen Therapy: Patient Spontanous Breathing  Post-op Pain: mild  Post-op Assessment: Post-op Vital signs reviewed  Post-op Vital Signs: Reviewed  Last Vitals:  Filed Vitals:   03/13/14 1130  BP: 114/77  Pulse: 73  Temp:   Resp: 16    Complications: No apparent anesthesia complications

## 2014-03-13 NOTE — Transfer of Care (Signed)
Immediate Anesthesia Transfer of Care Note  Patient: Thomas Woodard  Procedure(s) Performed: Procedure(s): ENDOSCOPIC SINUS SURGERY WITH FUSION NAVIGATION (N/A) BILATERAL TURBINATE REDUCTION (Bilateral)  Patient Location: PACU  Anesthesia Type:General  Level of Consciousness: awake, sedated and patient cooperative  Airway & Oxygen Therapy: Patient Spontanous Breathing and aerosol face mask  Post-op Assessment: Report given to PACU RN and Post -op Vital signs reviewed and stable  Post vital signs: Reviewed and stable  Complications: No apparent anesthesia complications

## 2014-03-13 NOTE — Discharge Instructions (Signed)

## 2014-03-13 NOTE — Anesthesia Preprocedure Evaluation (Addendum)
Anesthesia Evaluation  Patient identified by MRN, date of birth, ID band Patient awake    Reviewed: Allergy & Precautions, H&P , NPO status , Patient's Chart, lab work & pertinent test results  Airway Mallampati: I TM Distance: >3 FB Neck ROM: Full    Dental  (+) Teeth Intact, Dental Advisory Given   Pulmonary  breath sounds clear to auscultation        Cardiovascular hypertension, Pt. on medications Rhythm:Regular Rate:Normal     Neuro/Psych    GI/Hepatic   Endo/Other  Morbid obesity  Renal/GU      Musculoskeletal   Abdominal   Peds  Hematology   Anesthesia Other Findings   Reproductive/Obstetrics                          Anesthesia Physical Anesthesia Plan  ASA: II  Anesthesia Plan: General   Post-op Pain Management:    Induction: Inhalational  Airway Management Planned: LMA  Additional Equipment:   Intra-op Plan:   Post-operative Plan: Extubation in OR  Informed Consent: I have reviewed the patients History and Physical, chart, labs and discussed the procedure including the risks, benefits and alternatives for the proposed anesthesia with the patient or authorized representative who has indicated his/her understanding and acceptance.   Dental advisory given  Plan Discussed with: CRNA, Anesthesiologist and Surgeon  Anesthesia Plan Comments:         Anesthesia Quick Evaluation

## 2014-03-14 ENCOUNTER — Encounter (HOSPITAL_BASED_OUTPATIENT_CLINIC_OR_DEPARTMENT_OTHER): Payer: Self-pay | Admitting: Otolaryngology

## 2015-03-11 ENCOUNTER — Ambulatory Visit: Payer: 59 | Attending: Internal Medicine

## 2017-01-26 ENCOUNTER — Ambulatory Visit (HOSPITAL_BASED_OUTPATIENT_CLINIC_OR_DEPARTMENT_OTHER)
Admit: 2017-01-26 | Discharge: 2017-01-26 | Disposition: A | Discharge status: Home / Self Care | Payer: BLUE CROSS/BLUE SHIELD | Attending: Emergency Medicine | Admitting: Emergency Medicine

## 2017-01-26 ENCOUNTER — Encounter (HOSPITAL_COMMUNITY): Payer: Self-pay

## 2017-01-26 ENCOUNTER — Emergency Department (HOSPITAL_COMMUNITY)
Admission: EM | Admit: 2017-01-26 | Discharge: 2017-01-26 | Disposition: A | Discharge status: Home / Self Care | Payer: BLUE CROSS/BLUE SHIELD | Attending: Emergency Medicine | Admitting: Emergency Medicine

## 2017-01-26 ENCOUNTER — Emergency Department (HOSPITAL_COMMUNITY): Payer: BLUE CROSS/BLUE SHIELD

## 2017-01-26 DIAGNOSIS — R0602 Shortness of breath: Secondary | ICD-10-CM | POA: Diagnosis not present

## 2017-01-26 DIAGNOSIS — I10 Essential (primary) hypertension: Secondary | ICD-10-CM | POA: Insufficient documentation

## 2017-01-26 DIAGNOSIS — M7989 Other specified soft tissue disorders: Secondary | ICD-10-CM | POA: Diagnosis not present

## 2017-01-26 DIAGNOSIS — L03115 Cellulitis of right lower limb: Secondary | ICD-10-CM | POA: Diagnosis not present

## 2017-01-26 DIAGNOSIS — Z79899 Other long term (current) drug therapy: Secondary | ICD-10-CM | POA: Diagnosis not present

## 2017-01-26 LAB — CBC WITH DIFFERENTIAL/PLATELET
Basophils Absolute: 0 10*3/uL (ref 0.0–0.1)
Basophils Relative: 1 %
EOS ABS: 0.4 10*3/uL (ref 0.0–0.7)
EOS PCT: 8 %
HCT: 48.8 % (ref 39.0–52.0)
Hemoglobin: 16.2 g/dL (ref 13.0–17.0)
Lymphocytes Relative: 39 %
Lymphs Abs: 2 10*3/uL (ref 0.7–4.0)
MCH: 30.6 pg (ref 26.0–34.0)
MCHC: 33.2 g/dL (ref 30.0–36.0)
MCV: 92.2 fL (ref 78.0–100.0)
MONOS PCT: 8 %
Monocytes Absolute: 0.4 10*3/uL (ref 0.1–1.0)
Neutro Abs: 2.2 10*3/uL (ref 1.7–7.7)
Neutrophils Relative %: 44 %
PLATELETS: 209 10*3/uL (ref 150–400)
RBC: 5.29 MIL/uL (ref 4.22–5.81)
RDW: 12.4 % (ref 11.5–15.5)
WBC: 5 10*3/uL (ref 4.0–10.5)

## 2017-01-26 LAB — BASIC METABOLIC PANEL
Anion gap: 8 (ref 5–15)
BUN: 18 mg/dL (ref 6–20)
CO2: 31 mmol/L (ref 22–32)
CREATININE: 0.99 mg/dL (ref 0.61–1.24)
Calcium: 9.3 mg/dL (ref 8.9–10.3)
Chloride: 103 mmol/L (ref 101–111)
GFR calc Af Amer: >60 mL/min (ref >60)
GFR calc non Af Amer: >60 mL/min (ref >60)
Glucose, Bld: 102 mg/dL — ABNORMAL HIGH (ref 65–99)
Potassium: 3.9 mmol/L (ref 3.5–5.1)
SODIUM: 142 mmol/L (ref 135–145)

## 2017-01-26 LAB — I-STAT CHEM 8, ED
BUN: 21 mg/dL — AB (ref 6–20)
CHLORIDE: 102 mmol/L (ref 101–111)
CREATININE: 1 mg/dL (ref 0.61–1.24)
Calcium, Ion: 1.17 mmol/L (ref 1.15–1.40)
Glucose, Bld: 105 mg/dL — ABNORMAL HIGH (ref 65–99)
HEMATOCRIT: 50 % (ref 39.0–52.0)
Hemoglobin: 17 g/dL (ref 13.0–17.0)
Potassium: 3.9 mmol/L (ref 3.5–5.1)
Sodium: 141 mmol/L (ref 135–145)
TCO2: 32 mmol/L (ref 0–100)

## 2017-01-26 LAB — I-STAT TROPONIN, ED: TROPONIN I, POC: 0 ng/mL (ref 0.00–0.08)

## 2017-01-26 LAB — BRAIN NATRIURETIC PEPTIDE: B Natriuretic Peptide: 36.4 pg/mL (ref 0.0–100.0)

## 2017-01-26 MED ORDER — CEPHALEXIN 500 MG PO CAPS: 1000.0000 mg | ORAL_CAPSULE | Freq: Four times a day (QID) | ORAL | 0 refills | Status: DC

## 2017-01-26 MED ORDER — CEPHALEXIN 250 MG PO CAPS
1000.0000 mg | ORAL_CAPSULE | Freq: Once | ORAL | Status: AC
  Administered 2017-01-26: 1000 mg via ORAL
  Filled 2017-01-26: qty 4

## 2017-01-26 MED ORDER — CEPHALEXIN 500 MG PO CAPS: 1000.0000 mg | ORAL_CAPSULE | Freq: Four times a day (QID) | ORAL | 0 refills | Status: AC

## 2017-01-26 NOTE — Progress Notes (Signed)
*  PRELIMINARY RESULTS* Vascular Ultrasound Right lower extremity venous duplex has been completed.  Preliminary findings: No evidence of deep vein thrombosis or baker's cyst in the right lower extremity.   Thomas DonningCharlotte C Mahum Woodard 01/26/2017, 1:12 PM

## 2017-01-26 NOTE — ED Provider Notes (Signed)
[facility] DEPT Provider Note   CSN: [id number] Arrival date & time: 01/26/17  1057     History   Chief Complaint Chief Complaint  Patient presents with  . Leg Swelling    right    HPI Thomas Woodard is a 51 y.o. male.  51 yo M with a chief complaint of right leg swelling. This been going on for the past month. Some erythema and swelling. They deny injury. He has had some shortness of breath it's been long-standing. Denies fevers or chills. They're concerned about the extent of the redness. He has not yet sought medical care for this. Has had some recent travel to [place].   The history is provided by the patient.  Illness  This is a new problem. The current episode started less than 1 hour ago. The problem occurs constantly. The problem has not changed since onset.Pertinent negatives include no chest pain, no abdominal pain, no headaches and no shortness of breath. Nothing aggravates the symptoms. Nothing relieves the symptoms. He has tried nothing for the symptoms. The treatment provided no relief.    Past Medical History:  Diagnosis Date  . Chronic sinusitis   . Gout   . Hypertension    no meds  . Kidney stone   . Seasonal allergies     There are no active problems to display for this patient.   Past Surgical History:  Procedure Laterality Date  . ORIF RADIUS & ULNA FRACTURES     right arm as a teen  . SINUS ENDO W/FUSION N/A 03/13/2014   Procedure: ENDOSCOPIC SINUS SURGERY WITH FUSION NAVIGATION;  Surgeon: Suzanna Obey, MD;  Location: [facility];  Service: ENT;  Laterality: N/A;  . TURBINATE REDUCTION Bilateral 03/13/2014   Procedure: BILATERAL TURBINATE REDUCTION;  Surgeon: Suzanna Obey, MD;  Location: [facility];  Service: ENT;  Laterality: Bilateral;       Home Medications    Prior to Admission medications   Medication Sig Start Date End Date Taking? Authorizing Provider  Ascorbic Acid (VITAMIN C) 100 MG tablet Take 100  mg by mouth daily.   Yes Historical Provider, MD  Furosemide (LASIX PO) Take 1 tablet by mouth daily.   Yes Historical Provider, MD  naproxen sodium (ANAPROX) 220 MG tablet Take 220 mg by mouth daily.   Yes Historical Provider, MD  PRESCRIPTION MEDICATION Apply 1 application topically daily as needed (pain). Pain cream   Yes Historical Provider, MD  vitamin E 100 UNIT capsule Take 100 Units by mouth daily.   Yes Historical Provider, MD  albuterol (PROVENTIL HFA;VENTOLIN HFA) 108 (90 BASE) MCG/ACT inhaler Inhale 2 puffs into the lungs every 6 (six) hours as needed for wheezing or shortness of breath. Patient not taking: Reported on 01/26/2017 11/08/13   Dorothea Ogle, MD  cephALEXin (KEFLEX) 500 MG capsule Take 2 capsules (1,000 mg total) by mouth 4 (four) times daily. 01/26/17 02/05/17  Melene Plan, DO  HYDROcodone-acetaminophen (VICODIN) 5-500 MG per tablet Take 1 tablet by mouth every 6 (six) hours as needed for pain. Patient not taking: Reported on 01/26/2017 03/13/14   Suzanna Obey, MD    Family History Family History  Problem Relation Age of Onset  . Hypertension Other     Social History Social History  Substance Use Topics  . Smoking status: Never Smoker  . Smokeless tobacco: Never Used  . Alcohol use No     Comment: very rare     Allergies   Patient has  no known allergies.   Review of Systems Review of Systems  Constitutional: Negative for chills and fever.  HENT: Negative for congestion and facial swelling.   Eyes: Negative for discharge and visual disturbance.  Respiratory: Negative for shortness of breath.   Cardiovascular: Positive for leg swelling. Negative for chest pain and palpitations.  Gastrointestinal: Negative for abdominal pain, diarrhea and vomiting.  Musculoskeletal: Negative for arthralgias and myalgias.  Skin: Positive for color change. Negative for rash.  Neurological: Negative for tremors, syncope and headaches.  Psychiatric/Behavioral: Negative for confusion  and dysphoric mood.     Physical Exam Updated Vital Signs BP 123/93   Pulse 79   Temp 98.6 F (37 C) (Oral)   Resp (!) 27   Ht 5' 10.08" (1.78 m)   Wt 300 lb (136.1 kg)   SpO2 93%   BMI 42.95 kg/m   Physical Exam  Constitutional: He is oriented to person, place, and time. He appears well-developed and well-nourished.  HENT:  Head: Normocephalic and atraumatic.  Eyes: EOM are normal. Pupils are equal, round, and reactive to light.  Neck: Normal range of motion. Neck supple. No JVD present.  Cardiovascular: Normal rate and regular rhythm.  Exam reveals no gallop and no friction rub.   No murmur heard. Pulmonary/Chest: No respiratory distress. He has no wheezes.  Abdominal: He exhibits no distension and no mass. There is no tenderness. There is no rebound and no guarding.  Musculoskeletal: Normal range of motion. He exhibits edema and tenderness.  Erythema noted to the right lower extremity with some swelling. Extends up just past the knee. Pulse motor and sensation is intact distally.  Neurological: He is alert and oriented to person, place, and time.  Skin: No rash noted. No pallor.  Psychiatric: He has a normal mood and affect. His behavior is normal.  Nursing note and vitals reviewed.    ED Treatments / Results  Labs (all labs ordered are listed, but only abnormal results are displayed) Labs Reviewed  BASIC METABOLIC PANEL - Abnormal; Notable for the following:       Result Value   Glucose, Bld 102 (*)    All other components within normal limits  I-STAT CHEM 8, ED - Abnormal; Notable for the following:    BUN 21 (*)    Glucose, Bld 105 (*)    All other components within normal limits  CBC WITH DIFFERENTIAL/PLATELET  BRAIN NATRIURETIC PEPTIDE  I-STAT TROPOININ, ED    EKG  EKG Interpretation None       Radiology Dg Chest 2 View  Result Date: 01/26/2017 CLINICAL DATA:  Right leg redness EXAM: CHEST  2 VIEW COMPARISON:  04/26/2013 FINDINGS: Cardiac shadow  is within normal limits. Mild vascular congestion is seen without interstitial edema. No sizable effusion or focal infiltrate is noted. No bony abnormality is seen. IMPRESSION: Mild increased vascular congestion without focal infiltrate. Electronically Signed   By: Alcide Clever M.D.   On: 01/26/2017 12:01    Procedures Procedures (including critical care time)  Medications Ordered in ED Medications  cephALEXin (KEFLEX) capsule 1,000 mg (1,000 mg Oral Given 01/26/17 1139)     Initial Impression / Assessment and Plan / ED Course  I have reviewed the triage vital signs and the nursing notes.  Pertinent labs & imaging results that were available during my care of the patient were reviewed by me and considered in my medical decision making (see chart for details).     51 yo M with a cc  of a leg swelling.  Going on for past month.  Clinically appears to be cellulitis. Patient is not sought medical care prior to today. He is not having fevers and no white count. The started him on antibiotics. Ultrasound was negative for acute DVT. Will have him follow with his PCP. He may require a repeat imaging study if this persists  He appears to be tachypnea. Abdomen sure of the etiology of this. The family states he has been having trouble breathing for some time. Do not feel is significant only worse with this leg swelling. As he has no DVT and is PERC negative I feel no need for chest imaging other than the chest x-ray which was unremarkable.  1:46 PM:  I have discussed the diagnosis/risks/treatment options with the patient and family and believe the pt to be eligible for discharge home to follow-up with PCP. We also discussed returning to the ED immediately if new or worsening sx occur. We discussed the sx which are most concerning (e.g., sudden worsening pain, fever, inability to tolerate by mouth) that necessitate immediate return. Medications administered to the patient during their visit and any new  prescriptions provided to the patient are listed below.  Medications given during this visit Medications  cephALEXin (KEFLEX) capsule 1,000 mg (1,000 mg Oral Given 01/26/17 1139)     The patient appears reasonably screen and/or stabilized for discharge and I doubt any other medical condition or other [facility] requiring further screening, evaluation, or treatment in the ED at this time prior to discharge.    Final Clinical Impressions(s) / ED Diagnoses   Final diagnoses:  Cellulitis of right lower extremity    New Prescriptions Current Discharge Medication List    START taking these medications   Details  cephALEXin (KEFLEX) 500 MG capsule Take 2 capsules (1,000 mg total) by mouth 4 (four) times daily. Qty: 80 capsule, Refills: 0         Melene Planan Nickie Warwick, DO 01/26/17 1347

## 2017-01-26 NOTE — Discharge Instructions (Signed)
Follow up with your PCP in a couple days for recheck.

## 2017-01-26 NOTE — ED Triage Notes (Signed)
Pt hs redness to the right leg for the past month that has moved from the foot to the mid upper leg. Pt recently traveled out of the country.

## 2017-02-10 ENCOUNTER — Ambulatory Visit (INDEPENDENT_AMBULATORY_CARE_PROVIDER_SITE_OTHER): Payer: BLUE CROSS/BLUE SHIELD | Admitting: Internal Medicine

## 2017-02-10 VITALS — BP 118/90 | HR 96 | Temp 97.7°F | Ht 70.0 in | Wt 311.0 lb

## 2017-02-10 DIAGNOSIS — G4700 Insomnia, unspecified: Secondary | ICD-10-CM

## 2017-02-10 DIAGNOSIS — L0390 Cellulitis, unspecified: Secondary | ICD-10-CM | POA: Diagnosis not present

## 2017-02-10 DIAGNOSIS — Z7689 Persons encountering health services in other specified circumstances: Secondary | ICD-10-CM

## 2017-02-10 DIAGNOSIS — M7989 Other specified soft tissue disorders: Secondary | ICD-10-CM | POA: Diagnosis not present

## 2017-02-10 DIAGNOSIS — I999 Unspecified disorder of circulatory system: Secondary | ICD-10-CM

## 2017-02-10 DIAGNOSIS — E669 Obesity, unspecified: Secondary | ICD-10-CM

## 2017-02-10 DIAGNOSIS — R0989 Other specified symptoms and signs involving the circulatory and respiratory systems: Secondary | ICD-10-CM

## 2017-02-10 DIAGNOSIS — IMO0001 Reserved for inherently not codable concepts without codable children: Secondary | ICD-10-CM

## 2017-02-10 DIAGNOSIS — Z6841 Body Mass Index (BMI) 40.0 and over, adult: Secondary | ICD-10-CM

## 2017-02-10 MED ORDER — CLINDAMYCIN HCL 300 MG PO CAPS: 300.0000 mg | ORAL_CAPSULE | Freq: Three times a day (TID) | ORAL | 0 refills | Status: DC

## 2017-02-10 NOTE — Patient Instructions (Signed)
[name],  Fue un placer conocerte hoy. Le recet clindamicina 300 mg para tomar tres veces al da para su pierna. Deja de tomar el keflex. Por favor, hganme un seguimiento la prxima semana para ver si tienen mejora. La mala circulacin tambin podra estar afectando su curacin, por lo que he pedido ultrasonidos de sus piernas.  Recibir New York Life Insuranceuna llamada sobre el estudio del sueo dentro de la prxima semana.  It was nice to meet you today. I have prescribed clindamycin 300 mg to take three times daily for your leg. Stop taking the keflex. Please follow up with me next week to see if you have improvement. Poor circulation could also be affecting your healing, so I have order ultrasounds of your legs.  You will get a call about the sleep study within the next week.  Best, Dr. Sampson GoonFitzgerald

## 2017-02-10 NOTE — Progress Notes (Signed)
[name] Progress Note  Subjective:  Thomas Woodard is a 51 y.o. male here to establish care. Spanish Agricultural consultant [phone]) assisted visit.  Concerns today:   - Ongoing R leg swelling, which began about 2 months ago when he was on vacation in [place]. There, he had redness and swelling in his lower leg and also involving his foot. He denies pain. He had trauma to the area but says swelling was prior to scraping his shin. He was given an IM dose of antibiotics in [place] that he thinks began with "cef" and had trial of lasix for swelling, but redness has now spread past knee. He denies pain, fevers. He was started on keflex 1000 mg qid on 3/7 after an ED visit with prescription written for 10-day supply, though patient still has a few pills left 15 days since Rx written. Patient had LE [place] negative for DVT on 3/7 with enlarged lymph node noted. Erythema was noted to extend past knee on 3/7, as well.  - Frequent sleep awakenings, snoring with concern for sleep apnea. Patient's girlfriend showed video of patient sleeping flat and snoring loudly with pauses in his breathing. Patient reports he does not feel rested after sleep and has concerns he might fall asleep while driving. He says he has had long-standing shortness of breath that improved some after having sinus surgery. Prior to sinus surgery he occasionally used an albuterol inhaler but has not needed since.   PMH: - Chronic sinusitis s/p turbinate reduction and endoscopic sinus surgery 02/2014 - R arm ORIF for radius and ulna fractures as a teenager - Kidney stone - Seasonal allergies - High blood pressure noted in past but not on medication - Gout  Family Hx: - Colon cancer in father (diagnosed at age over 45; says sister had colon and liver cancer at 43) - HTN in mother, father, and sister  Social:  Works at [employer].  Completed some high school.  Grew up in [place].  Has a girlfriend.  Does not  smoke, drink, or use drugs.  Has some trouble with mood, which he attributes to trouble sleeping and having little energy. Denies SI/HI. Is trying to lose weight by dieting but does not regularly exercise.   No Known Allergies  Objective: Blood pressure 118/90, pulse 96, temperature 97.7 F (36.5 C), temperature source Oral, height 5\' 10"  (1.778 m), weight (!) 311 lb (141.1 kg), SpO2 (!) 87 %. Repeat SpO2 ranged from 93-98% -- patient has very callused fingers, suspect poor initial read. Body mass index is 44.62 kg/m. Constitutional: Morbidly obese male in NAD HENT: MMM. mallampati class III/IV Cardiovascular: RRR, S1, S2, no m/r/g. Cannot palpate PT or DP pulses bilaterally but feet warm. Faint popliteal pulses.  Pulmonary/Chest: Effort normal and breath sounds normal. No wheezing or respiratory distress.  Abdominal: Soft. +BS, NT, obese.  Musculoskeletal: 1+ pitting edema R LE.   Neurological: AOx3, no focal deficits. Skin: Erythema from right ankle to just above R knee with increased warmth R compared to left. Abrasion of R shin without drainage. R leg NT. See picture below.  Psychiatric: Normal mood and affect.  Vitals reviewed        Assessment/Plan: Right leg swelling - Worsening per patient. Likely cellulitis but patient without systemic signs of infection like fever. Has tried cephalosporins only for treatment. - Prescribed clindamycin 300 mg TID x 10 days, as suspect Strep since no sign of abscess - Poor LE circulation likely contributing to poor healing -  Recommend ABIs - Could be stasis dermatitis but would expect similar findings in L leg - DVT ruled out at recent ED visit with negative venous dopplers  Frequent nocturnal awakening - Will order sleep study as patient at high risk for OSA given obesity, documented pauses in breathing  Health maintenance: Will recommend TDAP and colonoscopy (family member with colon cancer at [phone]) at next visit  Follow-up next week  to assess for improvement in R leg swelling and erythema.  Dani GobbleHillary Fitzgerald, MD [facility], PGY-2

## 2017-02-12 ENCOUNTER — Encounter: Payer: Self-pay | Admitting: Internal Medicine

## 2017-02-12 DIAGNOSIS — E669 Obesity, unspecified: Secondary | ICD-10-CM | POA: Insufficient documentation

## 2017-02-12 DIAGNOSIS — G4700 Insomnia, unspecified: Secondary | ICD-10-CM | POA: Insufficient documentation

## 2017-02-12 DIAGNOSIS — M7989 Other specified soft tissue disorders: Secondary | ICD-10-CM | POA: Insufficient documentation

## 2017-02-12 NOTE — Assessment & Plan Note (Signed)
-   Will order sleep study as patient at high risk for OSA given obesity, documented pauses in breathing

## 2017-02-12 NOTE — Assessment & Plan Note (Signed)
-   Worsening per patient. Likely cellulitis but patient without systemic signs of infection like fever. Has tried cephalosporins only for treatment. - Prescribed clindamycin 300 mg TID x 10 days, as suspect Strep since no sign of abscess - Poor LE circulation likely contributing to poor healing - Recommend ABIs - Could be stasis dermatitis but would expect similar findings in L leg - DVT ruled out at recent ED visit with negative venous dopplers

## 2017-02-14 ENCOUNTER — Ambulatory Visit (INDEPENDENT_AMBULATORY_CARE_PROVIDER_SITE_OTHER): Payer: BLUE CROSS/BLUE SHIELD | Admitting: Internal Medicine

## 2017-02-14 ENCOUNTER — Encounter: Payer: Self-pay | Admitting: Pharmacist

## 2017-02-14 ENCOUNTER — Encounter: Payer: Self-pay | Admitting: Internal Medicine

## 2017-02-14 ENCOUNTER — Ambulatory Visit (INDEPENDENT_AMBULATORY_CARE_PROVIDER_SITE_OTHER): Payer: BLUE CROSS/BLUE SHIELD | Admitting: Pharmacist

## 2017-02-14 VITALS — BP 120/84 | HR 98 | Temp 98.4°F | Wt 312.4 lb

## 2017-02-14 DIAGNOSIS — M7989 Other specified soft tissue disorders: Secondary | ICD-10-CM | POA: Diagnosis not present

## 2017-02-14 DIAGNOSIS — R202 Paresthesia of skin: Secondary | ICD-10-CM | POA: Diagnosis not present

## 2017-02-14 DIAGNOSIS — Z23 Encounter for immunization: Secondary | ICD-10-CM

## 2017-02-14 DIAGNOSIS — R200 Anesthesia of skin: Secondary | ICD-10-CM

## 2017-02-14 LAB — POCT GLYCOSYLATED HEMOGLOBIN (HGB A1C): Hemoglobin A1C: 5.7

## 2017-02-14 NOTE — Patient Instructions (Signed)
No evidence of poor blood flow in legs.  Follow up with Dr. Sampson GoonFitzgerald next.

## 2017-02-14 NOTE — Progress Notes (Signed)
Reviewed: Agree with Dr. Koval's documentation and management. 

## 2017-02-14 NOTE — Assessment & Plan Note (Signed)
Normal ABI and low likelihood of PAD based on ABI of > 1.  Symptoms of tingling and hyperreflexia are concerning.    Encouraged elevation to attempt reduction in leg swelling.  Discussed evaluation with PCP Dr. Sampson GoonFitzgerald who also saw patient today.

## 2017-02-14 NOTE — Patient Instructions (Signed)
Sr. Thomas Woodard, Fue un placer verte de nuevo hoy. Me alegra que la hinchazn sea un poco mejor. Contine con la clindamicina tres veces al da [place] que se le acaben las pastillas. Mantenga sus piernas elevadas como pueda. Si no conoce el estudio del sueo a mediados de WellPointesta semana, llame a Latvianuestra clnica y ver cul es el [place]traso. Mejor, Dr. Sampson Woodard   [name],  It was nice to see you again today. I'm glad the swelling is a little better.   Continue clindamycin three times daily until you run out of pills. Keep you legs elevated as able.   If you don't hear about the sleep study by the middle of this week, please call our clinic, and I'll see what the hold up is.  Best, Dr. Sampson Woodard   Apnea del sueo (Sleep Apnea) La apnea del sueo es un trastorno que afecta la respiracin. Las Dealerpersonas con apnea del sueo tienen momentos en los que hacen breves pausas al respirar o en los que respiran de forma superficial mientras duermen. La apnea del sueo puede causar estos sntomas:  Dificultad para quedarse dormido.  Sueo o cansancio Administratordurante el da.  Irritabilidad.  Ronquidos fuertes.  Dolores de cabeza matutinos.  Dificultad para concentrarse.  Olvido de cosas.  Menos inters sexual.  Somnolencia sin motivo.  Cambios en el estado de nimo.  Cambios en la personalidad.  Depresin.  Muchos despertares nocturnos para orinar.  M.D.C. HoldingsBoca seca.  Dolor de Advertising copywritergarganta. CUIDADOS EN EL HOGAR  Haga los cambios en su rutina que le haya recomendado el mdico.  Consuma una dieta sana y Alesia Bandabien equilibrada.  Tome los medicamentos de venta libre y los recetados solamente como se lo haya indicado el mdico.  Evite el alcohol, los calmantes (sedantes) y los medicamentos opiceos.  Si tiene sobrepeso, tome medidas para bajar de [employer] Eriepeso.  Si le proporcionaron un dispositivo para usar mientras duerme, selo solamente como se lo haya indicado el mdico.  No consuma ningn producto que contenga  tabaco, lo que incluye cigarrillos, tabaco de Theatre managermascar y Administrator, Civil Servicecigarrillos electrnicos. Si necesita ayuda para dejar de fumar, consulte al mdico.  Concurra a todas las visitas de control como se lo haya indicado el mdico. Esto es importante. SOLICITE AYUDA SI:  El dispositivo que recibi para usar mientras duerme es incmodo o parece no funcionar.  Los sntomas no mejoran.  Los sntomas empeoran. SOLICITE AYUDA DE INMEDIATO SI:  Le duele el pecho.  Tiene dificultad para inhalar suficiente aire (falta de aire).  Tiene molestias en la espalda, en los brazos o en el [place] Colonyestmago.  Presenta dificultad para hablar.  Siente debilidad en un lado del cuerpo.  Se la cae un lado de la cara. Estos sntomas pueden Customer service managerindicar una emergencia. No espere hasta que los sntomas desaparezcan. Solicite atencin mdica de inmediato. Comunquese con el servicio de emergencias de su localidad (911 en los [place]). No conduzca por sus propios medios OfficeMax Incorporatedhasta el hospital. Esta informacin no tiene Theme park managercomo fin reemplazar el consejo del mdico. Asegrese de hacerle al mdico cualquier pregunta que tenga. Document Released: 12/11/2010 Document Revised: 03/01/2016 Document Reviewed: 08/18/2015 Elsevier Interactive Patient Education  2017 [employer] Inc[employer]

## 2017-02-14 NOTE — Progress Notes (Signed)
    S:    Patient arrives accompanied by wife, labored breathing while walking into exam room, with no complaints of dyspnea.  IPAD interpretation through [place] #[id number].    She presents to the clinic for PADABI evaluation.  Patient was referred and last seen by PCP on 02/10/2017.    Denies pain with walking.  Patient reports pins and needles in upper legs with elevation.   O:  Lower extremity Physical Exam includes Right Leg twitching (hyperreflexic while laying flat on exam table.  Right foot is warm and erythematous (reported as improved after starting antibiotics).  Left foot cool on palpation.   Diminished pulses of dorsalis pedis bilaterally - left more difficult to palpate with vascular doppler.  Diminished limb hair.   ABI overall = > 1 Right Arm 134 mmHg    Left Arm 138 mmHg Right ankle posterior tibial 140 mmHg     dorsalis pedis 138 mmHg Left ankle posterior tibial 142 mmHg    dorsalis pedis 136 mmHg   A/P: Normal ABI and low likelihood of PAD based on ABI of > 1.  Symptoms of tingling and hyperreflexia are concerning.    Encouraged elevation to attempt reduction in leg swelling.  Discussed evaluation with PCP Dr. Sampson GoonFitzgerald who also saw patient today.   No change in medications.  Results reviewed and written information provided.  Total time in face-to-face counseling 35 minutes.  Patient seen with Elta GuadeloupeJustin Crowder, PharmD Candidate.

## 2017-02-15 NOTE — Assessment & Plan Note (Signed)
-   Cellulitis somewhat improved in terms of degree of swelling. - Recommended completing clindamycin and following up next week. - Advised patient to keep legs elevated as much as he can tolerate to help with swelling. - ABIs normal, so circulation does not appear to be a hindrance to healing - Will obtain hgba1c given obesity and complaint of leg tingling, though suspect twitching and tingling could be peripheral neuropathy from axonal damage 2/2 undiagnosed OSA (sleep study order placed last OV). Dr. Raymondo BandKoval, however, noted hyperreflexia which would be more consistent with an UMN injury-- no midline spinal tenderness to palpation on exam.

## 2017-02-15 NOTE — Progress Notes (Addendum)
[facility] Progress [facility]  Subjective:  Thomas Woodard is a 51 y.o. male with morbid obesity, chronic sinusitis s/p turbinate reduction, and seasonal allergies who presents to follow-up R leg cellulitis. Patient's wife present. Visit assisted by Spanish Video Interpreter Thomas Woodard [phone]).  #Cellulitis: - Has been ongoing for about 2 months - Switched antibiotic to clindamycin for better Strep coverage on 3/22 (last Thursday-- so on day 5 of 10 day course) - Thinks swelling has come down some and redness a little better - Still without fever, pain or nausea - Had ABIs performed by Dr. Raymondo BandKoval just prior to this visit for concern of feeble pulses at last visit; these were normal - Has been trying to keep legs elevated but is painful, gets cramps  Of note, patient had jerking motions of his legs during ABIs, but patient said this was because provider's hands were very cold and that he would fall asleep and jerk awake. However, he does have burning sensation in his thighs when standing for long periods of time.  No Known Allergies  Social: Never smoker. Works next door to where he lives, so fortunately does not need to drive in setting of what appears to be severe OSA.   Objective: Blood pressure 120/84, pulse 98, temperature 98.4 F (36.9 C), temperature source Oral, weight (!) 312 lb 6.4 oz (141.7 kg), SpO2 95 %. Body mass index is 44.82 kg/m. Constitutional: Tired appearing male, in NAD Cardiovascular: RRR, S1, S2, no m/r/g.  Pulmonary/Chest: Effort normal and breath sounds normal. No respiratory distress but does have loud breathing at baseline.  Musculoskeletal: 1+ edema of R leg. No midline spinal tenderness to palpation.  Skin: Erythema up to R knee with increased warmth compared to L leg.  Vitals reviewed      Assessment/Plan: Right leg swelling - Cellulitis somewhat improved in terms of degree of swelling. - Recommended completing clindamycin and  following up next week. - Advised patient to keep legs elevated as much as he can tolerate to help with swelling. - ABIs normal, so circulation does not appear to be a hindrance to healing - Will obtain hgba1c given obesity and complaint of leg tingling, though suspect twitching and tingling could be peripheral neuropathy from axonal damage 2/2 undiagnosed OSA (sleep study order placed last OV). Dr. Raymondo BandKoval, however, noted hyperreflexia which would be more consistent with an UMN injury-- no midline spinal tenderness to palpation on exam.   Health Maintenance: TDAP administered and handout on colonoscopy given (early dx of colon cancer in father)  Follow-up next week after completion of 10-day course of clindamycin.  Dani GobbleHillary Jerrelle Michelsen, MD [facility], PGY-2

## 2017-02-16 ENCOUNTER — Ambulatory Visit: Payer: BLUE CROSS/BLUE SHIELD | Admitting: Internal Medicine

## 2017-02-22 ENCOUNTER — Telehealth: Payer: Self-pay | Admitting: Internal Medicine

## 2017-02-22 ENCOUNTER — Ambulatory Visit (HOSPITAL_COMMUNITY)
Admission: RE | Admit: 2017-02-22 | Discharge: 2017-02-22 | Disposition: A | Discharge status: Home / Self Care | Payer: BLUE CROSS/BLUE SHIELD | Source: Ambulatory Visit | Attending: Family Medicine | Admitting: Family Medicine

## 2017-02-22 ENCOUNTER — Ambulatory Visit (INDEPENDENT_AMBULATORY_CARE_PROVIDER_SITE_OTHER): Payer: BLUE CROSS/BLUE SHIELD | Admitting: Internal Medicine

## 2017-02-22 VITALS — BP 124/84 | HR 94 | Temp 98.2°F | Wt 315.4 lb

## 2017-02-22 DIAGNOSIS — L539 Erythematous condition, unspecified: Secondary | ICD-10-CM | POA: Diagnosis not present

## 2017-02-22 DIAGNOSIS — M7989 Other specified soft tissue disorders: Secondary | ICD-10-CM | POA: Diagnosis not present

## 2017-02-22 DIAGNOSIS — I999 Unspecified disorder of circulatory system: Secondary | ICD-10-CM | POA: Diagnosis present

## 2017-02-22 DIAGNOSIS — G4700 Insomnia, unspecified: Secondary | ICD-10-CM

## 2017-02-22 DIAGNOSIS — R0989 Other specified symptoms and signs involving the circulatory and respiratory systems: Secondary | ICD-10-CM

## 2017-02-22 NOTE — Patient Instructions (Signed)
Sr. Thomas Woodard,  Lo llamar con los resultados de la biopsia.  Por favor, mantenga la pierna elevada y pruebe medias de compresin (XL).  Tambin lo referir a Corporate investment banker (especialistas en piel).  Estamos trabajando para que su seguro de sueo sea aprobado. Esto debera resolverse esta semana.  Mejor, Dr. Sampson Goon   [name],  I will call you with biopsy results.  Please keep leg elevated and try compression stockings (XL).   I will also refer you to Dermatology (skin specialists).  We are working on getting your sleep study approved by insurance. This should be resolved this week.  Best, Dr. Sampson Goon

## 2017-02-22 NOTE — Telephone Encounter (Signed)
Received fax requesting peer-to-peer with patient's insurance to authorize sleep study. Authorization number given was [id number]. Called [facility] at [phone] but office closed. Left message with authorization number and callback number.  Please call [facility] to confirm they received this information. Thank you.

## 2017-02-22 NOTE — Assessment & Plan Note (Addendum)
-   Chronic, worsening somewhat. Initially treated as cellulitis without improvement. Differential includes lipodermatosclerosis given patient's obesity and bilateral LE edema, venous stasis/insufficiency - Obtained punch biopsy to r/o cutaneous dematoses  - Ordered dermatology consult - Will call patient with result of biopsy - Advised patient to keep feet elevated as much as possible and to wear compression stockings during day to help relieve LE swelling - Recommended weight loss -- should continue to address at follow-up

## 2017-02-22 NOTE — Progress Notes (Addendum)
[name] Progress Note  Subjective:  Thomas Woodard is a 51 y.o. male with history of obesity and suspected sleep apnea who presents for follow-up of R leg swelling and erythema. Visit assisted by Spanish video interpreter Helmut Muster [phone]).   #Right leg swelling: - Ongoing for over 2 months and has not improved with several courses of antibiotics, most recently completed 10-day course of clindamycin; no inciting injury - Minimal pain unless standing for long periods of time; no itching - Has had testing showing normal ABIs - Thinks swelling is continuing to spread of thigh - Is concerned because really has not improved - Had negative LE doppler and negative d-dimer when evaluated in ED in early March ROS: No fevers, no n/v/d  #Concern for sleep apnea: - Wife has shown video of patient waking up while sleeping - Patient feels exhausted - Has not received call about sleep study appointment yet  Social: Never smoker  No Known Allergies  Objective: Blood pressure 124/84, pulse 94, temperature 98.2 F (36.8 C), temperature source Oral, weight (!) 315 lb 6.4 oz (143.1 kg), SpO2 90 %. Body mass index is 45.26 kg/m. Constitutional: Obese male, appears very tired and occasionally nods off during visit HENT: Injected conjunctiva Cardiovascular: RRR, S1, S2, no m/r/g. Palpable DP and PT pulses bilaterally.  Pulmonary/Chest: Effort normal and breath sounds normal. No respiratory distress.  Musculoskeletal: 1+ pitting edema of lower extremities but with dimpling of skin over R calf and tighter, woodier feel to R LE. Neurological: AOx3, no focal deficits. Skin: Skin of left lower leg warmer compared to right. Erythema extends from R ankle to just above knee and into R medial thigh.   Psychiatric: Normal mood and affect.  Vitals reviewed     Assessment/Plan: Procedure note: punch biopsy Written informed consent received. Area was cleaned with alcohol swabs x 2,  then injected with a total of 2 cc 2% lidocaine.  Good anesthesia achieved.  Punch biopsy taken at R lateral calf.  Hemostasis achieved after holding pressure and applying pressure dressing.  Area covered with gauze and tape Patient was advised to keep area clean and dry and change dressing daily for the next few days to prevent infection.   Right leg swelling - Chronic, worsening somewhat. Initially treated as cellulitis without improvement. Differential includes lipodermatosclerosis given patient's obesity and bilateral LE edema, venous stasis/insufficiency - Obtained punch biopsy to r/o cutaneous dematoses  - Ordered dermatology consult - Will call patient with result of biopsy - Advised patient to keep feet elevated as much as possible and to wear compression stockings during day to help relieve LE swelling - Recommended weight loss -- should continue to address at follow-up  Frequent nocturnal awakening - Received authorization from patient's insurance company to have performed - Sent message to [employer] Team to contact [facility] to set up appointment  Follow-up pending biopsy results.  Dani Gobble, MD [name], PGY-2

## 2017-02-22 NOTE — Assessment & Plan Note (Signed)
-   Received authorization from patient's insurance company to have performed - Sent message to [employer] Team to contact [facility] to set up appointment

## 2017-02-22 NOTE — Progress Notes (Signed)
VASCULAR LAB PRELIMINARY  ARTERIAL  ABI completed:    RIGHT    LEFT    PRESSURE WAVEFORM  PRESSURE WAVEFORM  BRACHIAL 161 Triphasic BRACHIAL 155 Triphasic  DP 192 Triphasic DP 178 Tripjhasic  PT 204 Triphasic PT 173 Triphasic    RIGHT LEFT  ABI 1.27 1.11   ABIs and Doppler waveforms indicate normal arterial flow at rest.  Anabela Crayton, RVS 02/22/2017, 2:15 PM

## 2017-02-23 NOTE — Telephone Encounter (Signed)
Called [facility] center to inform of below, they state they will contact patient to schedule.

## 2017-02-25 ENCOUNTER — Encounter: Payer: Self-pay | Admitting: Internal Medicine

## 2017-02-25 ENCOUNTER — Telehealth: Payer: Self-pay | Admitting: Internal Medicine

## 2017-02-25 NOTE — Telephone Encounter (Signed)
Reviewed results with patient with help of Spanish phone interpreter. Results suggest an inflammatory panniculitis, perhaps erythema nodosum. Recommended continuing leg elevation, compression hose, and taking ibuprofen. Had negative chest x-ray in March but should check strep titers and PPD vs quant gold at follow-up. Asked him to follow-up in about 2 weeks. Can keep dermatology appointment for second opinion. Patient also mentions he does have his sleep study scheduled for 5/30.

## 2017-03-15 ENCOUNTER — Encounter: Payer: Self-pay | Admitting: Internal Medicine

## 2017-03-15 ENCOUNTER — Ambulatory Visit (INDEPENDENT_AMBULATORY_CARE_PROVIDER_SITE_OTHER): Payer: BLUE CROSS/BLUE SHIELD | Admitting: Internal Medicine

## 2017-03-15 VITALS — BP 126/84 | HR 103 | Temp 98.4°F | Ht 70.0 in | Wt 318.0 lb

## 2017-03-15 DIAGNOSIS — L089 Local infection of the skin and subcutaneous tissue, unspecified: Secondary | ICD-10-CM | POA: Diagnosis not present

## 2017-03-15 DIAGNOSIS — I50810 Right heart failure, unspecified: Secondary | ICD-10-CM | POA: Diagnosis not present

## 2017-03-15 DIAGNOSIS — M7989 Other specified soft tissue disorders: Secondary | ICD-10-CM

## 2017-03-15 MED ORDER — NAPROXEN SODIUM 220 MG PO TABS: 220.0000 mg | ORAL_TABLET | Freq: Every day | ORAL | 4 refills | Status: AC

## 2017-03-15 NOTE — Assessment & Plan Note (Addendum)
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 Do not suspect infection, as patient remains afebrile. May have more than one process going on, as does have bilateral LE edema.  - Will check CBC, ASO titers, CMP (has had normal SCr but LFTs and albumin not checked), and ESR to further investigate EN diagnosis. Will defer TB testing given recent normal CXR but could consider obtaining quant gold. - Will look into status of Dermatology referral, as patient would benefit from second opinion given duration of symptoms - Recommended Sockwell compression socks, since patient could not tolerate full compression hose - Encouraged taking NSAIDs prn for discomfort and inflammtion - Continue elevating legs as much as possible - Will order ECHO to look right heart failure, given bilateral LE edema, slight weight gain and concern for severe OSA

## 2017-03-15 NOTE — Patient Instructions (Signed)
Sr. Gerald Dexter intentar avanzar en tu estudio del sueo y deseara que te hicieras una ecografa de tu corazn.  Intente usar calcetines Sockwell para ayudar con el fluido en sus piernas. Puedes obtener estos en [employer].  Buscar la cita de Dermatologa.  Te llamar con resultados de laboratorio.  Mejor, Dr. Sampson Goon

## 2017-03-15 NOTE — Progress Notes (Signed)
[facility] Progress Note  Subjective:  Thomas Woodard is a 51 y.o. male who presents for follow-up of R leg erythema and swelling. Patient's wife present, as well. Visit assisted by [place] phone interpreter Luiz Ochoa [phone]).   #Right Leg Swelling/Erythema: - Has been ongoing for last 3 months. This is 3rd OV for follow-up. S/p multiple rounds of antibiotics, including keflex and clindamycin, and trial of lasix.  - R lower leg seems less swollen to patient but still feels very stiff/tense. He is concerned that swelling of R thigh continues to advance. No swelling of scrotum.  - ED visit 01/26/17 with [place] negative for DVT, had only mild interstitial edema on CXR, BNP WNL at 36.4 - Normal ABIs on 02/22/17 - At last visit had recommended compression hose but patient could not tolerate due to discomfort; tried only for 2 days for a few hours at a time. Has noticed improved swelling after keeping his legs elevated at night.  - Not taking anything for discomfort - Did not hear about Dermatology consult from last appointment - Skin biopsy from 02/22/17 read as "septal panniculitis such as erythema nodosum" by Pathology  ROS: Weight gain, orthopnea; no fevers or chills  Of note, patient still with symptoms very concerning for sleep apnea--frequent nighttime awakenings, falling asleep while talking, eating, and extreme daytime sleepiness. Currently able to walk to work, as has fallen asleep driving before.   Objective: Blood pressure 126/84, pulse (!) 103, temperature 98.4 F (36.9 C), temperature source Oral, height 5' 10" (1.778 m), weight (!) 318 lb (144.2 kg), SpO2 (!) 88 %. Recheck SpO2 was 92% at rest and between 91 and 95% while ambulating.   Body mass index is 45.63 kg/m. Constitutional: Obese male, very tired appearing, nodding off during appointment at times Cardiovascular: RRR, S1, S2, no m/r/g. Femoral and distal pulses intact.  Pulmonary/Chest: Breath  sounds throughout and no crackles appreciated, though exam somewhat limited by habitus. Speaking in complete sentences with ease.  Abdominal: Obese Musculoskeletal: 1-2+ edema of LEs bilaterally though R leg feels more tense. 1+ pitting edema of R thigh. TTP over R calcaneous.  Skin: Hyperpigmentation changes that appear consistent with hemosiderin deposits present across R > L lower extremity. Erythema and increased warmth of R thigh. Site of biopsy on R calf well healed. Area across R tibia from fall about 3 months ago still scabbed.  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 Do not suspect infection, as patient remains afebrile. May have more than one process going on, as does have bilateral LE edema.  - Will check CBC, ASO titers, CMP (has had normal SCr but LFTs and albumin not checked), and ESR to further investigate EN diagnosis. Will defer TB testing given recent normal CXR but could consider obtaining quant gold. - Will look into status of Dermatology referral, as patient would benefit from second opinion given duration of symptoms - Recommended Sockwell compression socks, since patient could not tolerate full compression hose - Encouraged taking NSAIDs prn for discomfort and inflammtion - Continue elevating legs as much as possible - Will order ECHO to look right heart failure, given bilateral LE edema, slight weight gain and concern for severe OSA  Precepted with Dr. Wendy Poet.   Will attempt to have sleep study scheduled earlier, as patient's QoL is significantly affected. Counseled to sleep in recliner for time being.  Recommended heel stretches for possible R plantar fasciitis (though  symptoms only present x 2 days and may be 2/2 gross swelling of R leg).   Follow-up after ECHO and sleep study.  Future Appointments Date Time Provider [place]  03/28/2017 1:00 PM [facility] ECHO 2  [id number]  04/20/2017 8:00 PM [facility] ROOM 3 [facility]   Olene Floss, MD [place], PGY-2

## 2017-03-16 ENCOUNTER — Telehealth: Payer: Self-pay | Admitting: Internal Medicine

## 2017-03-16 LAB — COMPREHENSIVE METABOLIC PANEL
ALBUMIN: 4.3 g/dL (ref 3.5–5.5)
ALT: 39 IU/L (ref 0–44)
AST: 18 IU/L (ref 0–40)
Albumin/Globulin Ratio: 1.7 (ref 1.2–2.2)
Alkaline Phosphatase: 79 IU/L (ref 39–117)
BUN / CREAT RATIO: 16 (ref 9–20)
BUN: 21 mg/dL (ref 6–24)
Bilirubin Total: <0.2 mg/dL (ref 0.0–1.2)
CO2: 28 mmol/L (ref 18–29)
CREATININE: 1.3 mg/dL — AB (ref 0.76–1.27)
Calcium: 9.5 mg/dL (ref 8.7–10.2)
Chloride: 100 mmol/L (ref 96–106)
GFR calc Af Amer: 75 mL/min/{1.73_m2} (ref >59)
GFR, EST NON AFRICAN AMERICAN: 65 mL/min/{1.73_m2} (ref >59)
GLOBULIN, TOTAL: 2.5 g/dL (ref 1.5–4.5)
GLUCOSE: 139 mg/dL — AB (ref 65–99)
Potassium: 4.3 mmol/L (ref 3.5–5.2)
SODIUM: 142 mmol/L (ref 134–144)
Total Protein: 6.8 g/dL (ref 6.0–8.5)

## 2017-03-16 LAB — ANTISTREPTOLYSIN O TITER: ASO: <20 IU/mL (ref 0.0–200.0)

## 2017-03-16 LAB — CBC WITH DIFFERENTIAL
BASOS: 1 %
Basophils Absolute: 0 10*3/uL (ref 0.0–0.2)
EOS (ABSOLUTE): 0.5 10*3/uL — AB (ref 0.0–0.4)
Eos: 10 %
Hematocrit: 47.8 % (ref 37.5–51.0)
Hemoglobin: 15.7 g/dL (ref 13.0–17.7)
IMMATURE GRANS (ABS): 0 10*3/uL (ref 0.0–0.1)
Immature Granulocytes: 0 %
Lymphocytes Absolute: 1.6 10*3/uL (ref 0.7–3.1)
Lymphs: 32 %
MCH: 30.3 pg (ref 26.6–33.0)
MCHC: 32.8 g/dL (ref 31.5–35.7)
MCV: 92 fL (ref 79–97)
Monocytes Absolute: 0.5 10*3/uL (ref 0.1–0.9)
Monocytes: 10 %
NEUTROS PCT: 47 %
Neutrophils Absolute: 2.3 10*3/uL (ref 1.4–7.0)
RBC: 5.18 x10E6/uL (ref 4.14–5.80)
RDW: 13.2 % (ref 12.3–15.4)
WBC: 4.9 10*3/uL (ref 3.4–10.8)

## 2017-03-16 LAB — SEDIMENTATION RATE: Sed Rate: 17 mm/hr — ABNORMAL HIGH (ref 0–15)

## 2017-03-16 NOTE — Telephone Encounter (Signed)
Called [facility] to request that patient's sleep study be moved up. They do have an opening this Sunday, 4/29. Center will call patient to see if he can reschedule.  Windell Moment, MD [name], PGY-2

## 2017-03-20 ENCOUNTER — Ambulatory Visit (HOSPITAL_BASED_OUTPATIENT_CLINIC_OR_DEPARTMENT_OTHER): Payer: BLUE CROSS/BLUE SHIELD | Attending: Family Medicine | Admitting: Internal Medicine

## 2017-03-20 VITALS — Ht 70.0 in | Wt 310.0 lb

## 2017-03-20 DIAGNOSIS — G4736 Sleep related hypoventilation in conditions classified elsewhere: Secondary | ICD-10-CM | POA: Insufficient documentation

## 2017-03-20 DIAGNOSIS — G4700 Insomnia, unspecified: Secondary | ICD-10-CM

## 2017-03-20 DIAGNOSIS — R0683 Snoring: Secondary | ICD-10-CM

## 2017-03-20 DIAGNOSIS — Z6841 Body Mass Index (BMI) 40.0 and over, adult: Secondary | ICD-10-CM | POA: Insufficient documentation

## 2017-03-20 DIAGNOSIS — G4733 Obstructive sleep apnea (adult) (pediatric): Secondary | ICD-10-CM | POA: Insufficient documentation

## 2017-03-23 ENCOUNTER — Telehealth: Payer: Self-pay | Admitting: Internal Medicine

## 2017-03-23 NOTE — Telephone Encounter (Signed)
[facility] called stating pt stopped breathing 111 times per hour during his sleep study. Dr. Jennette Kettle gave the ok for pt to go back to the sleep clinic on Friday. [facility] will be faxing over preliminary results. ep

## 2017-03-25 ENCOUNTER — Ambulatory Visit (HOSPITAL_BASED_OUTPATIENT_CLINIC_OR_DEPARTMENT_OTHER): Payer: BLUE CROSS/BLUE SHIELD | Attending: Family Medicine | Admitting: Pulmonary Disease

## 2017-03-28 ENCOUNTER — Ambulatory Visit (HOSPITAL_COMMUNITY): Payer: BLUE CROSS/BLUE SHIELD | Attending: Cardiology

## 2017-03-28 ENCOUNTER — Other Ambulatory Visit: Payer: Self-pay

## 2017-03-28 DIAGNOSIS — I50810 Right heart failure, unspecified: Secondary | ICD-10-CM | POA: Insufficient documentation

## 2017-03-28 NOTE — Progress Notes (Unsigned)
I appreciate Ms. Alviris Almonte from [employer] for interpreting.

## 2017-03-29 DIAGNOSIS — R0683 Snoring: Secondary | ICD-10-CM

## 2017-03-29 NOTE — Procedures (Signed)
Patient Name: Thomas Woodard, Damyen Study Date: 03/20/2017 Gender: Male D.O.B: 05/19/1966 Age (years): 50 Referring Provider: Denny LevySara Neal Height (inches): 70 Interpreting Physician: Jetty Duhamellinton Arad Burston MD, ABSM Weight (lbs): 310 RPSGT: Cherylann ParrDubili, Fred BMI: 44 MRN: [id number] Neck Size: 21.50 CLINICAL INFORMATION Sleep Study Type: NPSG     Indication for sleep study: Daytime Fatigue, Fatigue, Morbid Obesity, Obesity, Periodic Limb Movement Disorder, Restless Sleep with Limb Movments, Snoring, Witnesses Apnea / Gasping During Sleep  Epworth Sleepiness Score: 15/24  SLEEP STUDY TECHNIQUE As per the AASM Manual for the Scoring of Sleep and Associated Events v2.3 (April 2016) with a hypopnea requiring 4% desaturations.  The channels recorded and monitored were frontal, central and occipital EEG, electrooculogram (EOG), submentalis EMG (chin), nasal and oral airflow, thoracic and abdominal wall motion, anterior tibialis EMG, snore microphone, electrocardiogram, and pulse oximetry.  MEDICATIONS Medications self-administered by patient taken the night of the study : none reported  SLEEP ARCHITECTURE The study was initiated at 10:41:47 PM and ended at 4:43:50 AM.  Sleep onset time was 1.2 minutes and the sleep efficiency was 70.6%. The total sleep time was 255.5 minutes.  Stage REM latency was 161.0 minutes.  The patient spent 76.13% of the night in stage N1 sleep, 15.46% in stage N2 sleep, 0.00% in stage N3 and 8.41% in REM.  Alpha intrusion was absent.  Supine sleep was 98.91%.  RESPIRATORY PARAMETERS The overall apnea/hypopnea index (AHI) was 111.1 per hour. There were 68 total apneas, including 66 obstructive, 1 central and 1 mixed apneas. There were 405 hypopneas and 31 RERAs.  The AHI during Stage REM sleep was 61.4 per hour.  AHI while supine was 111.1 per hour.  The mean oxygen saturation was 79.74%. The minimum SpO2 during sleep was 56.00%.  Loud snoring was noted  during this study.  CARDIAC DATA The 2 lead EKG demonstrated sinus rhythm. The mean heart rate was 82.43 beats per minute. Other EKG findings include: None.  LEG MOVEMENT DATA The total PLMS were 0 with a resulting PLMS index of 0.00. Associated arousal with leg movement index was 0.0 .  IMPRESSIONS - Severe obstructive sleep apnea occurred during this study (AHI = 111.1/h). - Study was ordered as a diagnostic polysomnogram, without CPAP. - No significant central sleep apnea occurred during this study (CAI = 0.2/h). - Severe oxygen desaturation was noted during this study (Min O2 = 56.00%). Supplemental O2 1L/min added at 11:56 PM per protocol for low sats. Mean O2 sat 79.7%.  - The patient snored with Loud snoring volume. - No cardiac abnormalities were noted during this study. - Clinically significant periodic limb movements did not occur during sleep. No significant associated arousals.  DIAGNOSIS - Obstructive Sleep Apnea (327.23 [G47.33 ICD-10]) - Nocturnal Hypoxemia (327.26 [G47.36 ICD-10])  RECOMMENDATIONS - Therapeutic CPAP titration to determine optimal pressure required to alleviate sleep disordered breathing. - Assess underlying cardiopulmonary status and consider need for O2 during sleep in addition to CPAP. - Avoid alcohol, sedatives and other CNS depressants that may worsen sleep apnea and disrupt normal sleep architecture. - Sleep hygiene should be reviewed to assess factors that may improve sleep quality. - Weight management and regular exercise should be initiated or continued if appropriate.  [Electronically signed] 03/29/2017 01:30 PM  Jetty Duhamellinton Markos Theil MD, ABSM Diplomate, American Board of Sleep Medicine   NPI: [id number]  Waymon BudgeYOUNG,Zaiyden Strozier D Diplomate, American Board of Sleep Medicine  ELECTRONICALLY SIGNED ON:  03/29/2017, 1:24 PM [facility] PH: [phone]   FX: [phone]  [place] OF SLEEP MEDICINE

## 2017-03-30 ENCOUNTER — Ambulatory Visit (HOSPITAL_BASED_OUTPATIENT_CLINIC_OR_DEPARTMENT_OTHER): Payer: Self-pay | Attending: Family Medicine | Admitting: Internal Medicine

## 2017-03-30 VITALS — Ht 68.0 in | Wt 300.0 lb

## 2017-03-30 DIAGNOSIS — G4733 Obstructive sleep apnea (adult) (pediatric): Secondary | ICD-10-CM | POA: Insufficient documentation

## 2017-04-01 ENCOUNTER — Encounter: Payer: Self-pay | Admitting: Internal Medicine

## 2017-04-09 DIAGNOSIS — G4733 Obstructive sleep apnea (adult) (pediatric): Secondary | ICD-10-CM

## 2017-04-09 NOTE — Procedures (Signed)
Patient Name: Thomas Woodard, Thomas Woodard Date: 03/30/2017 Gender: Male D.O.B: Aug 27, 1966 Age (years): 50 Referring Provider: Dorcas Mcmurray Height (inches): 29 Interpreting Physician: Baird Lyons MD, ABSM Weight (lbs): 310 RPSGT: Carolin Coy BMI: 44 MRN: [id number] Neck Size: 21.50 CLINICAL INFORMATION The patient is referred for a split night study with BPAP. Most recent polysomnogram dated 03/20/2017 revealed an AHI of 109/h and RDI of 117.3/h. MEDICATIONS Medications self-administered by patient taken the night of the study : none reported  SLEEP STUDY TECHNIQUE As per the AASM Manual for the Scoring of Sleep and Associated Events v2.3 (April 2016) with a hypopnea requiring 4% desaturations.  The channels recorded and monitored were frontal, central and occipital EEG, electrooculogram (EOG), submentalis EMG (chin), nasal and oral airflow, thoracic and abdominal wall motion, anterior tibialis EMG, snore microphone, electrocardiogram, and pulse oximetry. Bi-level positive airway pressure (BiPAP) was initiated when the patient met split night criteria and was titrated according to treat sleep-disordered breathing.  RESPIRATORY PARAMETERS Diagnostic  Total AHI (/hr): 103.3 RDI (/hr): 109.2 OA Index (/hr): 61.1 CA Index (/hr): 0.0 REM AHI (/hr): 30.3 NREM AHI (/hr): 121.0 Supine AHI (/hr): 103.3 Non-supine AHI (/hr): N/A Min O2 Sat (%): 55.00 Mean O2 (%): 83.74 Time below 88% (min): 209.5   Titration  Optimal IPAP Pressure (cm): 30 Optimal EPAP Pressure (cm): 25 AHI at Optimal Pressure (/hr): 12.2 Min O2 at Optimal Pressure (%): 74.0 Sleep % at Optimal (%): 93 Supine % at Optimal (%): 100      SLEEP ARCHITECTURE The study was initiated at 10:46:38 PM and terminated at 4:55:50 AM. The total recorded time was 369.2 minutes. EEG confirmed total sleep time was 294.5 minutes yielding a sleep efficiency of 79.8%. Sleep onset after lights out was 1.5 minutes with a REM latency of  194.5 minutes. The patient spent 55.01% of the night in stage N1 sleep, 25.47% in stage N2 sleep, 0.00% in stage N3 and 19.52% in REM. Wake after sleep onset (WASO) was 73.2 minutes. The Arousal Index was 112.1/hour.  LEG MOVEMENT DATA The total Periodic Limb Movements of Sleep (PLMS) were 1. The PLMS index was 0.20 .  CARDIAC DATA The 2 lead EKG demonstrated sinus rhythm. The mean heart rate was 78.29 beats per minute. Other EKG findings include: PVCs.  IMPRESSIONS - Severe obstructive sleep apnea occurred during the diagnostic portion of the study (AHI = 103.3 /hour). CPAP gave inadequate control at tolerated pressure. Optimal BiPAP pressures were selected for this patient ( 30 / 25 cm of water) - No significant central sleep apnea occurred during the diagnostic portion of the study (CAI = 0.0/hour). - The patient snored with Loud snoring volume during the diagnostic portion of the study. - EKG findings include PVCs. - Clinically significant periodic limb movements of sleep did not occur during the study. - Supplemental O2 1L/Min was added per protocol at 4:06 AM for persistent sat< /= 88% on BIPAP.  DIAGNOSIS - Obstructive Sleep Apnea (327.23 [G47.33 ICD-10]) - Nocturnal Hypoxemia (327.26 [G47.36 ICD-10])  RECOMMENDATIONS - Trial of BiPAP therapy on 30/25 cm H2O with a Medium size Fisher&Paykel Full Face Mask Simplus mask and heated humidification. - Avoid alcohol, sedatives and other CNS depressants that may worsen sleep apnea and disrupt normal sleep architecture. - Sleep hygiene should be reviewed to assess factors that may improve sleep quality. - Weight management and regular exercise should be initiated or continued. - Patient may need supplemental O2 through BIPAP if oxygenation isn't adequate at final pressures.  [Electronically signed]  04/09/2017 03:52 PM  Baird Lyons MD, [place], American Board of Sleep Medicine   NPI: [id number]  [place],  American Board of Sleep Medicine  ELECTRONICALLY SIGNED ON:  04/09/2017, 3:47 PM [place] PH: [phone]   FX: [phone] [place]

## 2017-04-11 ENCOUNTER — Encounter: Payer: Self-pay | Admitting: Family Medicine

## 2017-04-11 DIAGNOSIS — G4733 Obstructive sleep apnea (adult) (pediatric): Secondary | ICD-10-CM | POA: Insufficient documentation

## 2017-04-20 ENCOUNTER — Encounter (HOSPITAL_BASED_OUTPATIENT_CLINIC_OR_DEPARTMENT_OTHER): Payer: BLUE CROSS/BLUE SHIELD

## 2017-07-08 IMAGING — DX DG CHEST 2V
2 series · 2 of 2 positions shown · non-contrast
Comparison: 04/26/2013

CLINICAL DATA: Right leg redness

EXAM:
CHEST  2 VIEW

[w chest pa]
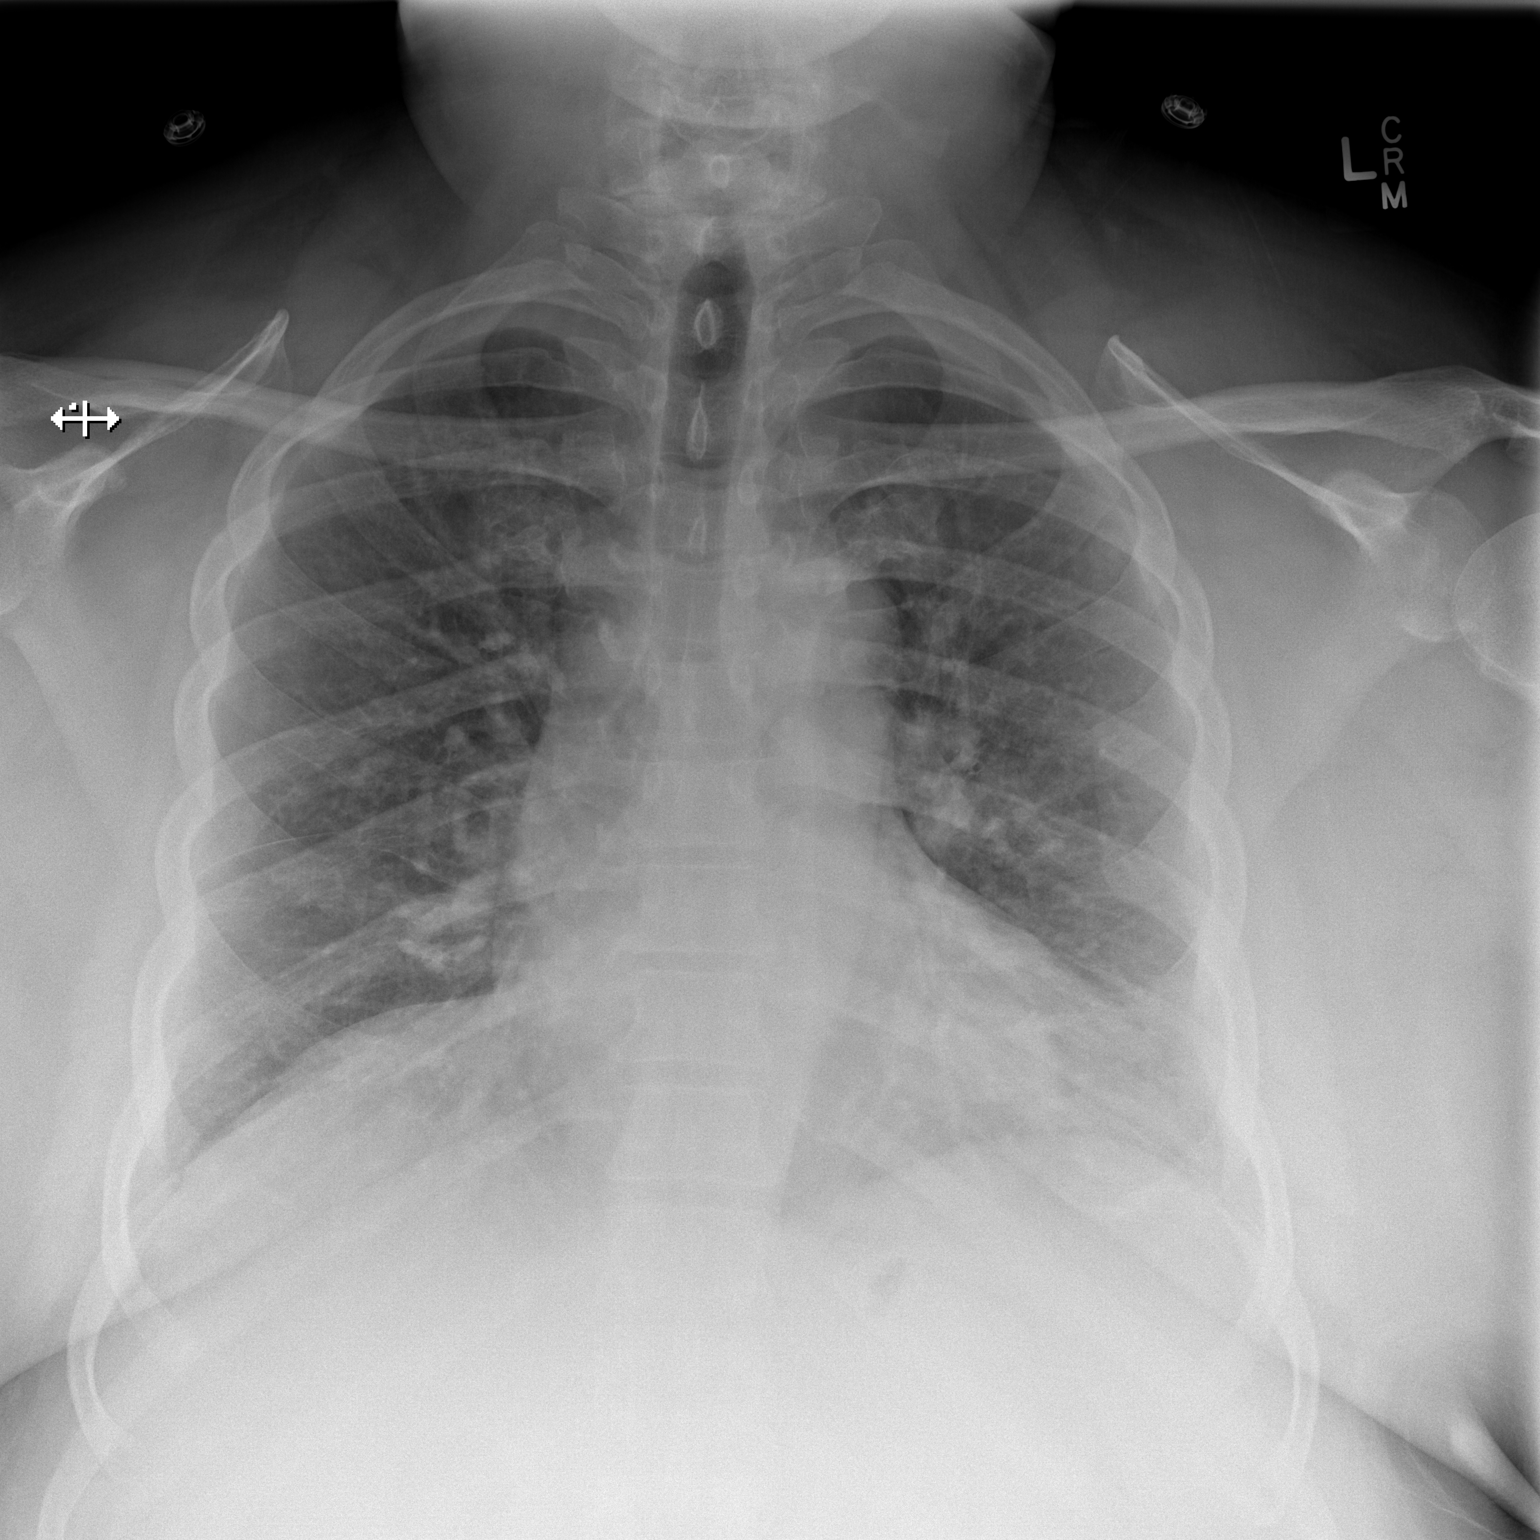

[w chest lat]
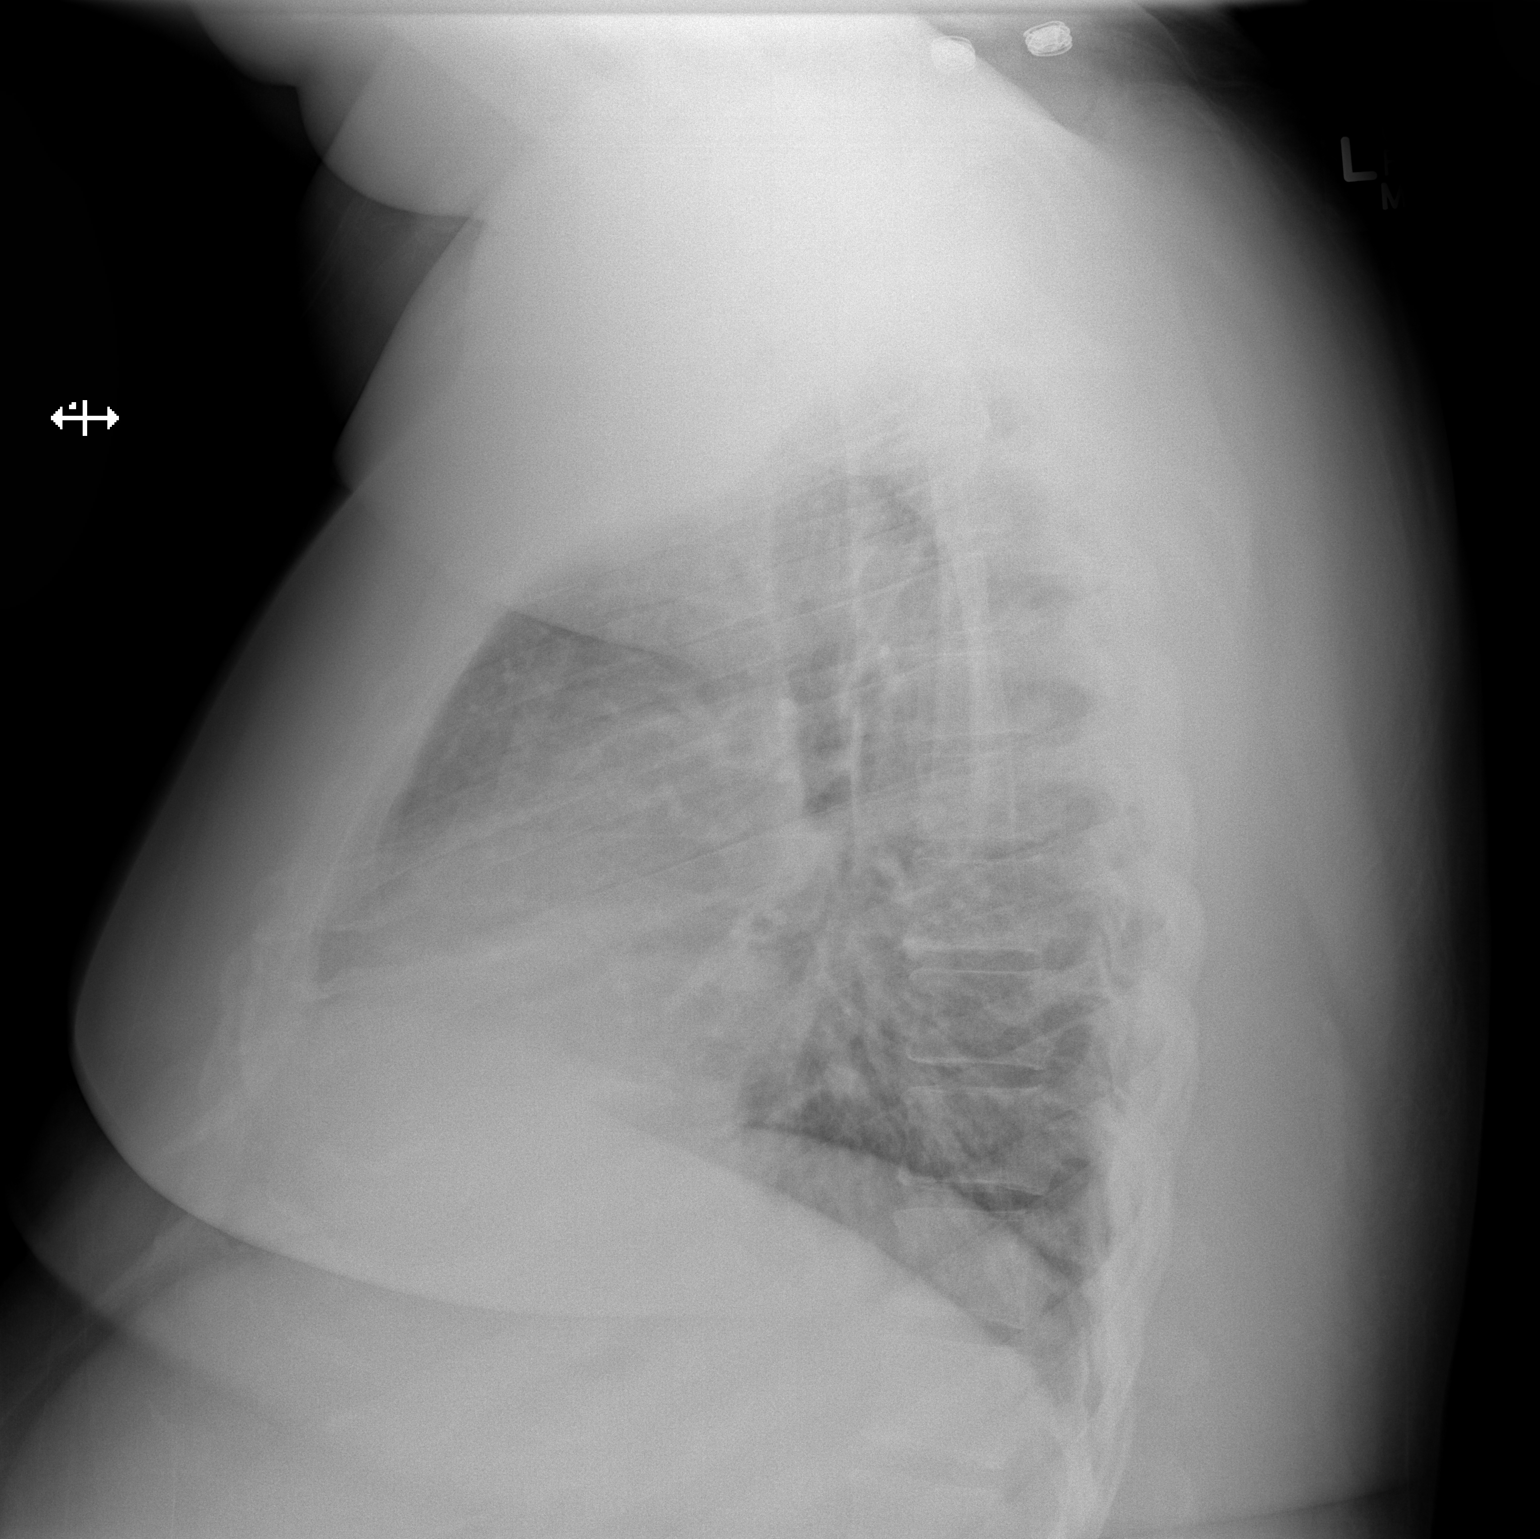

[2 of 2 positions shown; findings below may reference images not displayed]

FINDINGS: Cardiac shadow is within normal limits. Mild vascular congestion is
seen without interstitial edema. No sizable effusion or focal
infiltrate is noted. No bony abnormality is seen.
IMPRESSION: Mild increased vascular congestion without focal infiltrate.

## 2017-11-18 NOTE — Progress Notes (Signed)
error 

## 2021-12-30 ENCOUNTER — Ambulatory Visit
Admission: EM | Admit: 2021-12-30 | Discharge: 2021-12-30 | Disposition: A | Discharge status: Home / Self Care | Payer: Self-pay | Attending: Urgent Care | Admitting: Urgent Care

## 2021-12-30 ENCOUNTER — Other Ambulatory Visit: Payer: Self-pay

## 2021-12-30 DIAGNOSIS — R519 Headache, unspecified: Secondary | ICD-10-CM

## 2021-12-30 DIAGNOSIS — S0101XA Laceration without foreign body of scalp, initial encounter: Secondary | ICD-10-CM

## 2021-12-30 DIAGNOSIS — Z23 Encounter for immunization: Secondary | ICD-10-CM

## 2021-12-30 MED ORDER — TETANUS-DIPHTH-ACELL PERTUSSIS 5-2.5-18.5 LF-MCG/0.5 IM SUSY
0.5000 mL | PREFILLED_SYRINGE | Freq: Once | INTRAMUSCULAR | Status: AC
  Administered 2021-12-30: 0.5 mL via INTRAMUSCULAR

## 2021-12-30 NOTE — ED Provider Notes (Signed)
[facility]   MRN: [id number] DOB: 01-16-1966  Subjective:   Thomas Woodard is a 56 y.o. male presenting for suffering a laceration of the scalp today.  Patient was with the hood of his car open.  Unfortunately, it hit against the scalp as he came down.  Denies loss of consciousness.  No confusion, dizziness.  Patient did not clean the wound.  He only rinsed it with water.  Tdap is not up-to-date.  Denies taking chronic medications.   No Known Allergies  Past Medical History:  Diagnosis Date   Chronic sinusitis    Gout    Hypertension    no meds   Kidney stone    Seasonal allergies      Past Surgical History:  Procedure Laterality Date   ORIF RADIUS & ULNA FRACTURES     right arm as a teen   SINUS ENDO W/FUSION N/A 03/13/2014   Procedure: ENDOSCOPIC SINUS SURGERY WITH FUSION NAVIGATION;  Surgeon: Suzanna Obey, MD;  Location: [facility];  Service: ENT;  Laterality: N/A;   TURBINATE REDUCTION Bilateral 03/13/2014   Procedure: BILATERAL TURBINATE REDUCTION;  Surgeon: Suzanna Obey, MD;  Location:  SURGERY CENTER;  Service: ENT;  Laterality: Bilateral;    Family History  Problem Relation Age of Onset   Hypertension Other     Social History   Tobacco Use   Smoking status: Never   Smokeless tobacco: Never  Substance Use Topics   Alcohol use: No    Comment: very rare   Drug use: No    ROS   Objective:   Vitals: BP (!) 145/86 (BP Location: Left Arm)    Pulse 85    Temp 98 F (36.7 C) (Oral)    Resp 18    SpO2 96%   Physical Exam Constitutional:      General: He is not in acute distress.    Appearance: Normal appearance. He is well-developed and normal weight. He is not ill-appearing, toxic-appearing or diaphoretic.  HENT:     Head: Normocephalic and atraumatic.      Right Ear: External ear normal.     Left Ear: External ear normal.     Nose: Nose normal.     Mouth/Throat:     Pharynx: Oropharynx is clear.  Eyes:      General: No scleral icterus.       Right eye: No discharge.        Left eye: No discharge.     Extraocular Movements: Extraocular movements intact.  Cardiovascular:     Rate and Rhythm: Normal rate.  Pulmonary:     Effort: Pulmonary effort is normal.  Musculoskeletal:     Cervical back: Normal range of motion.  Neurological:     Mental Status: He is alert and oriented to person, place, and time.  Psychiatric:        Mood and Affect: Mood normal.        Behavior: Behavior normal.        Thought Content: Thought content normal.        Judgment: Judgment normal.    Wound cleansed using Hibiclens and a surgical scrub.  Wound was closed with staples.  Patient tolerated this well.  Assessment and Plan :   PDMP not reviewed this encounter.  1. Scalp laceration, initial encounter   2. Scalp pain   3. Need for Tdap vaccination    Successful laceration repair.  Use Tylenol for pain.  Wound care discussed.  Return  in about 10 days for staple removal.  Counseled patient on potential for adverse effects with medications prescribed/recommended today, ER and return-to-clinic precautions discussed, patient verbalized understanding.    Wallis Bamberg, [place] 12/30/21 [phone]

## 2021-12-30 NOTE — ED Triage Notes (Signed)
Pt c/o working under vehicle hood when it fell and caused laceration to head

## 2022-01-09 ENCOUNTER — Ambulatory Visit
Admission: EM | Admit: 2022-01-09 | Discharge: 2022-01-09 | Disposition: A | Discharge status: Home / Self Care | Payer: Self-pay | Attending: Physician Assistant | Admitting: Physician Assistant

## 2022-01-09 ENCOUNTER — Other Ambulatory Visit: Payer: Self-pay

## 2022-01-09 ENCOUNTER — Encounter: Payer: Self-pay | Admitting: Emergency Medicine

## 2022-01-09 DIAGNOSIS — Z4802 Encounter for removal of sutures: Secondary | ICD-10-CM

## 2022-01-09 NOTE — ED Triage Notes (Signed)
Pt here for staple removal; 6 staples removed and wound well healed

## 2024-01-30 ENCOUNTER — Ambulatory Visit: Payer: Self-pay | Admitting: Internal Medicine

## 2024-05-28 ENCOUNTER — Ambulatory Visit: Payer: Self-pay | Admitting: Internal Medicine
